# Patient Record
Sex: Female | Born: 1980 | Race: White | Hispanic: No | Marital: Married | Smoking: Never smoker
Health system: Southern US, Community
[De-identification: ages and names within clinical notes are randomized; demographics above are authoritative.]

## PROBLEM LIST (undated history)

## (undated) DIAGNOSIS — N390 Urinary tract infection, site not specified: Secondary | ICD-10-CM

## (undated) DIAGNOSIS — T7840XA Allergy, unspecified, initial encounter: Secondary | ICD-10-CM

## (undated) DIAGNOSIS — J302 Other seasonal allergic rhinitis: Secondary | ICD-10-CM

## (undated) DIAGNOSIS — O169 Unspecified maternal hypertension, unspecified trimester: Secondary | ICD-10-CM

## (undated) DIAGNOSIS — I10 Essential (primary) hypertension: Secondary | ICD-10-CM

## (undated) DIAGNOSIS — R12 Heartburn: Secondary | ICD-10-CM

## (undated) HISTORY — DX: Urinary tract infection, site not specified: N39.0

## (undated) HISTORY — PX: CHOLECYSTECTOMY: SHX55

## (undated) HISTORY — DX: Heartburn: R12

## (undated) HISTORY — DX: Other seasonal allergic rhinitis: J30.2

## (undated) HISTORY — DX: Unspecified maternal hypertension, unspecified trimester: O16.9

## (undated) HISTORY — DX: Allergy, unspecified, initial encounter: T78.40XA

---

## 2002-02-15 ENCOUNTER — Other Ambulatory Visit: Admission: RE | Admit: 2002-02-15 | Discharge: 2002-02-15 | Payer: Self-pay | Admitting: Obstetrics & Gynecology

## 2003-02-22 ENCOUNTER — Other Ambulatory Visit: Admission: RE | Admit: 2003-02-22 | Discharge: 2003-02-22 | Payer: Self-pay | Admitting: Obstetrics & Gynecology

## 2004-07-09 ENCOUNTER — Emergency Department (HOSPITAL_COMMUNITY): Admission: EM | Admit: 2004-07-09 | Discharge: 2004-07-09 | Payer: Self-pay | Admitting: Family Medicine

## 2004-09-15 ENCOUNTER — Emergency Department (HOSPITAL_COMMUNITY): Admission: EM | Admit: 2004-09-15 | Discharge: 2004-09-15 | Payer: Self-pay | Admitting: Family Medicine

## 2005-06-19 ENCOUNTER — Emergency Department (HOSPITAL_COMMUNITY): Admission: AD | Admit: 2005-06-19 | Discharge: 2005-06-19 | Payer: Self-pay | Admitting: Family Medicine

## 2006-04-12 ENCOUNTER — Other Ambulatory Visit: Admission: RE | Admit: 2006-04-12 | Discharge: 2006-04-12 | Payer: Self-pay | Admitting: Obstetrics and Gynecology

## 2007-08-10 DIAGNOSIS — O1490 Unspecified pre-eclampsia, unspecified trimester: Secondary | ICD-10-CM

## 2007-12-05 ENCOUNTER — Inpatient Hospital Stay (HOSPITAL_COMMUNITY): Admission: AD | Admit: 2007-12-05 | Discharge: 2007-12-05 | Payer: Self-pay | Admitting: Obstetrics and Gynecology

## 2007-12-07 ENCOUNTER — Inpatient Hospital Stay (HOSPITAL_COMMUNITY): Admission: AD | Admit: 2007-12-07 | Discharge: 2007-12-07 | Payer: Self-pay | Admitting: Obstetrics and Gynecology

## 2007-12-11 ENCOUNTER — Inpatient Hospital Stay (HOSPITAL_COMMUNITY): Admission: AD | Admit: 2007-12-11 | Discharge: 2007-12-15 | Payer: Self-pay | Admitting: Obstetrics and Gynecology

## 2008-08-29 ENCOUNTER — Emergency Department (HOSPITAL_COMMUNITY): Admission: EM | Admit: 2008-08-29 | Discharge: 2008-08-29 | Payer: Self-pay | Admitting: Family Medicine

## 2009-08-09 HISTORY — PX: CHOLECYSTECTOMY: SHX55

## 2010-05-26 ENCOUNTER — Ambulatory Visit (HOSPITAL_COMMUNITY): Admission: RE | Admit: 2010-05-26 | Discharge: 2010-05-26 | Payer: Self-pay | Admitting: General Surgery

## 2010-07-16 ENCOUNTER — Emergency Department (HOSPITAL_COMMUNITY): Admission: EM | Admit: 2010-07-16 | Discharge: 2010-03-21 | Payer: Self-pay | Admitting: Emergency Medicine

## 2010-10-21 LAB — CBC
HCT: 38.6 % (ref 36.0–46.0)
Hemoglobin: 12.1 g/dL (ref 12.0–15.0)
MCH: 26.9 pg (ref 26.0–34.0)
MCHC: 31.3 g/dL (ref 30.0–36.0)
MCV: 85.8 fL (ref 78.0–100.0)
Platelets: 274 10*3/uL (ref 150–400)
RBC: 4.5 MIL/uL (ref 3.87–5.11)
RDW: 14.4 % (ref 11.5–15.5)
WBC: 9 10*3/uL (ref 4.0–10.5)

## 2010-10-21 LAB — COMPREHENSIVE METABOLIC PANEL
ALT: 18 U/L (ref 0–35)
AST: 19 U/L (ref 0–37)
Albumin: 3.6 g/dL (ref 3.5–5.2)
Alkaline Phosphatase: 97 U/L (ref 39–117)
BUN: 7 mg/dL (ref 6–23)
CO2: 29 mEq/L (ref 19–32)
Calcium: 9.1 mg/dL (ref 8.4–10.5)
Chloride: 108 mEq/L (ref 96–112)
Creatinine, Ser: 0.75 mg/dL (ref 0.4–1.2)
GFR calc Af Amer: >60 mL/min (ref >60)
GFR calc non Af Amer: >60 mL/min (ref >60)
Glucose, Bld: 76 mg/dL (ref 70–99)
Potassium: 4.4 mEq/L (ref 3.5–5.1)
Sodium: 142 mEq/L (ref 135–145)
Total Bilirubin: 0.7 mg/dL (ref 0.3–1.2)
Total Protein: 6.7 g/dL (ref 6.0–8.3)

## 2010-10-21 LAB — DIFFERENTIAL
Basophils Absolute: 0 10*3/uL (ref 0.0–0.1)
Basophils Relative: 0 % (ref 0–1)
Eosinophils Absolute: 0 10*3/uL (ref 0.0–0.7)
Eosinophils Relative: 0 % (ref 0–5)
Lymphocytes Relative: 27 % (ref 12–46)
Lymphs Abs: 2.4 10*3/uL (ref 0.7–4.0)
Monocytes Absolute: 0.6 10*3/uL (ref 0.1–1.0)
Monocytes Relative: 7 % (ref 3–12)
Neutro Abs: 5.9 10*3/uL (ref 1.7–7.7)
Neutrophils Relative %: 66 % (ref 43–77)

## 2010-10-21 LAB — SURGICAL PCR SCREEN
MRSA, PCR: NEGATIVE
Staphylococcus aureus: NEGATIVE

## 2010-10-21 LAB — HCG, SERUM, QUALITATIVE: Preg, Serum: NEGATIVE

## 2010-10-23 LAB — DIFFERENTIAL
Basophils Absolute: 0 10*3/uL (ref 0.0–0.1)
Basophils Relative: 0 % (ref 0–1)
Eosinophils Absolute: 0 10*3/uL (ref 0.0–0.7)
Eosinophils Relative: 0 % (ref 0–5)
Lymphocytes Relative: 14 % (ref 12–46)
Lymphs Abs: 1.5 10*3/uL (ref 0.7–4.0)
Monocytes Absolute: 0.6 10*3/uL (ref 0.1–1.0)
Monocytes Relative: 6 % (ref 3–12)
Neutro Abs: 8.4 10*3/uL — ABNORMAL HIGH (ref 1.7–7.7)
Neutrophils Relative %: 79 % — ABNORMAL HIGH (ref 43–77)

## 2010-10-23 LAB — CBC
HCT: 37.4 % (ref 36.0–46.0)
Hemoglobin: 12.2 g/dL (ref 12.0–15.0)
MCH: 27.6 pg (ref 26.0–34.0)
MCHC: 32.6 g/dL (ref 30.0–36.0)
MCV: 84.6 fL (ref 78.0–100.0)
Platelets: 264 10*3/uL (ref 150–400)
RBC: 4.42 MIL/uL (ref 3.87–5.11)
RDW: 13.9 % (ref 11.5–15.5)
WBC: 10.6 10*3/uL — ABNORMAL HIGH (ref 4.0–10.5)

## 2010-10-23 LAB — URINE CULTURE
Colony Count: 60000
Culture  Setup Time: 201108131131

## 2010-10-23 LAB — COMPREHENSIVE METABOLIC PANEL
ALT: 18 U/L (ref 0–35)
AST: 17 U/L (ref 0–37)
Albumin: 3.7 g/dL (ref 3.5–5.2)
Alkaline Phosphatase: 77 U/L (ref 39–117)
BUN: 10 mg/dL (ref 6–23)
CO2: 24 mEq/L (ref 19–32)
Calcium: 8.4 mg/dL (ref 8.4–10.5)
Chloride: 104 mEq/L (ref 96–112)
Creatinine, Ser: 0.77 mg/dL (ref 0.4–1.2)
GFR calc Af Amer: >60 mL/min (ref >60)
GFR calc non Af Amer: >60 mL/min (ref >60)
Glucose, Bld: 116 mg/dL — ABNORMAL HIGH (ref 70–99)
Potassium: 3.6 mEq/L (ref 3.5–5.1)
Total Bilirubin: 0.5 mg/dL (ref 0.3–1.2)
Total Protein: 7.2 g/dL (ref 6.0–8.3)

## 2010-10-23 LAB — URINALYSIS, ROUTINE W REFLEX MICROSCOPIC
Bilirubin Urine: NEGATIVE
Glucose, UA: NEGATIVE mg/dL
Hgb urine dipstick: NEGATIVE
Ketones, ur: NEGATIVE mg/dL
Nitrite: NEGATIVE
Protein, ur: NEGATIVE mg/dL
Specific Gravity, Urine: 1.022 (ref 1.005–1.030)
Urobilinogen, UA: 1 mg/dL (ref 0.0–1.0)
pH: 7 (ref 5.0–8.0)

## 2010-10-23 LAB — POCT PREGNANCY, URINE: Preg Test, Ur: NEGATIVE

## 2010-10-23 LAB — LIPASE, BLOOD: Lipase: 26 U/L (ref 11–59)

## 2010-12-22 NOTE — Op Note (Signed)
NAMEVICKY, Robin Hopkins             ACCOUNT NO.:  [id number]   MEDICAL RECORD NO.:  [id number]          PATIENT TYPE:  INP   LOCATION:  9159                          FACILITY:  [facility]   PHYSICIAN:  Leighton Roach Meisinger, M.D.DATE OF BIRTH:  [birth year]   DATE OF PROCEDURE:  12/12/2007  DATE OF DISCHARGE:                               OPERATIVE REPORT   PREOPERATIVE DIAGNOSES:  Intrauterine pregnancy at 38 plus weeks,  preeclampsia, and arrest of descent.   POSTOPERATIVE DIAGNOSES:  Intrauterine pregnancy at 38 plus weeks,  preeclampsia, and arrest of descent.   PROCEDURE:  Primary low-transverse cesarean section without extensions.   SURGEON:  Zenaida Niece, MD   ASSISTANT:  Malachi Pro. Ambrose Mantle, MD   ANESTHESIA:  Epidural.   SPECIMENS:  Placenta sent to labor and delivery.   ESTIMATED BLOOD LOSS:  800 mL.   COMPLICATIONS:  None.   FINDINGS:  She had normal gravid anatomy and delivered a viable female  infant with Apgars of 4 and 9, weight 7 pounds 15 ounces.   PROCEDURE IN DETAIL:  The patient was taken to the operating room and  placed in the dorsal supine position with left lateral tilt.  Her  previously placed epidural was dosed appropriately.  Abdomen was then  prepped and draped in the usual sterile fashion.  A Foley catheter had  previously been placed.  The level of her anesthesia was found to be  adequate and abdomen was entered via a standard Pfannenstiel incision.  The Alexis disposable self-retaining retractor was placed in the lower  uterine segment and it was well exposed.  A 4-cm transverse incision was  made in the lower uterine segment.  Once the uterine cavity was entered,  the incision was extended digitally.  The fetal vertex was grasped and  delivered through the incision atraumatically.  Mouth and nares were  suctioned.  A nuchal cord x1 was reduced and the remainder of the infant  delivered atraumatically.  Cord was doubly clamped and cut and the  infant  handed to the awaiting pediatric team.  Cord blood was obtained  and the placenta was manually delivered.  Uterus was wiped dry with a  clean lap pads and all clots and debris removed.  Uterine incision was  inspected and found to be free of extensions.  The uterine incision was  closed in 2 layers with first layer being a running locking layer with  #1 chromic and the second layer being an imbricating layer also with #1  chromic.  A small amount of bleeding from the right side was controlled  with a  figure-of-eight suture of 3-0 Vicryl.  Tubes and ovaries were  inspected and found to be normal.  Uterine incision was again inspected  and found to be hemostatic.  Bleeding from serosal edges was controlled  with electrocautery.  The retractor was removed.  The subfascial space  was irrigated and made hemostatic with electrocautery.  The fascia was  closed in running fashion starting at both ends and meeting in the  middle with 0 Vicryl.  Subcutaneous tissue was irrigated and made  hemostatic  with  electrocautery.  Subcutaneous tissue was closed with running 2-0 plain  gut suture.  The skin was then closed with staples followed by sterile  dressing.  The patient tolerated the procedure well.  She was taken to  the recovery room in stable condition.  Counts were correct x2 and she  was given Ancef 1 g IV at the beginning of the procedure.      Zenaida Niece, M.D.  Electronically Signed     TDM/MEDQ  D:  12/12/2007  T:  12/12/2007  Job:  [id number]

## 2010-12-22 NOTE — Discharge Summary (Signed)
NAMETRISTEN, PENNINO             ACCOUNT NO.:  [id number]   MEDICAL RECORD NO.:  [id number]          PATIENT TYPE:  INP   LOCATION:  9104                          [facility]ACILITY:  [facility]   PHYSICIAN:  Zenaida Niece, M.D.DATE OF BIRTH:  Dec 18, 1980   DATE OF ADMISSION:  12/11/2007  DATE OF DISCHARGE:  12/15/2007                               DISCHARGE SUMMARY   ADMISSION DIAGNOSES:  1. Intrauterine pregnancy at 38 weeks.  2. Preeclampsia.   DISCHARGE DIAGNOSES:  1. Intrauterine pregnancy at 38 weeks.  2. Preeclampsia.  3. Arrest of descent.   PROCEDURES:  On May 5, she had a primary low-transverse cesarean  section.   HISTORY AND PHYSICAL:  This is a 30 year old white female gravida 1,  para 0 with an EGA of 38+ weeks who is admitted for induction due to  probable preeclampsia.  She was seen in the office on the day of  admission with blood pressure of 150/98 with no symptoms.  PIH labs were  drawn and were stable except for mildly elevated LFTs, so she was  admitted for induction.  Prenatal care complicated by nausea and  vomiting of pregnancy in the first trimester treated with Zofran,  polyhydramnios followed with NSTs and it resolved by ultrasound.  She  also had intermittent elevated blood pressure since 37 weeks with  elevated uric acid and no significant proteinuria.  She is also LGA by  ultrasound.  Prenatal labs blood type is A+ with negative antibody  screen, RPR nonreactive, rubella immune, hepatitis B surface antigen  negative, HIV negative, gonorrhea and chlamydia negative, cystic  fibrosis negative, 1-hour Glucola 106, group B strep is negative.   PAST MEDICAL HISTORY:  History of Bell palsy.  The remainder of her  history is noncontributory.   PHYSICAL EXAMINATION:  She is afebrile with stable vital signs except  for blood pressure of 130/90 on admission.  Fetal heart tracing is  reactive.  ABDOMEN:  Gravid, nontender with an estimated fetal weight of 9 pounds.  Cervix is 2, 70, -2 to -3, vertex presentation, adequate pelvis, and  membranes were ruptured revealing clear fluid.  EXTREMITIES:  2+ edema.  DTRs were 2/4.  No clonus.   HOSPITAL COURSE:  The patient was admitted, put on magnesium for seizure  prophylaxis, and started on Pitocin.  On my first exam, I was able to  rupture membranes.  She progressed into active labor, reached complete  and pushed for approximately 2 hours but was not able to bring the  vertex past a +1 to +2 station and became exhausted.  Thus early on the  morning of May 5, she had a primary low-transverse cesarean section for  arrest of descent under epidural anesthesia.  Estimated blood loss was  800 mL.  She had normal anatomy and delivered a viable female infant with  Apgars of 4 and 9, weight 7 pounds 15 ounces.  Postoperatively, she was  continued on her magnesium sulfate.  Blood pressures remained stable but  urine output was sluggish.  Preoperative hemoglobin 12 and on  postoperative day #1 was 10.2.  Creatinine remained slightly  elevated,  LFTs actually bumped up a little bit, LDH bumped up, and uric acid  bumped up.  She was continued on magnesium sulfate through the day on  postoperative #1.  The morning of postoperative #2, blood pressures were  stable and she was beginning to diurese.  Labs were improving, so her  magnesium was discontinued.  She was then transferred to the floor.  On  the floor, blood pressures remained stable.  On postoperative #3, labs  continued to improve and she was felt to be stable enough for discharge  home.  Her incision was healing well, but staples were left intact.   DISCHARGE INSTRUCTIONS:  Regular diet, pelvic rest, no strenuous  activity.  Followup is in 3-4 days for staple removal.  Medications are  Percocet #30 one to two p.o. q. 4-6 h. p.r.n. pain and over-the-counter  ibuprofen as needed, and she is given our discharge pamphlet.      Zenaida Niece, M.D.   Electronically Signed     TDM/MEDQ  D:  12/15/2007  T:  12/15/2007  Job:  [id number]

## 2011-05-04 LAB — CREATININE, URINE, 24 HOUR
Collection Interval-UCRE24: 24
Creatinine, 24H Ur: 1721
Creatinine, Urine: 172.1
Urine Total Volume-UCRE24: 1000

## 2011-05-04 LAB — URINALYSIS, ROUTINE W REFLEX MICROSCOPIC
Glucose, UA: NEGATIVE
Ketones, ur: 15 — AB
Leukocytes, UA: NEGATIVE
Nitrite: NEGATIVE
Protein, ur: 30 — AB
Specific Gravity, Urine: 1.025
Urobilinogen, UA: 2 — ABNORMAL HIGH
pH: 5.5

## 2011-05-04 LAB — PROTEIN, URINE, 24 HOUR
Collection Interval-UPROT: 24
Protein, 24H Urine: 100
Protein, Urine: 10
Urine Total Volume-UPROT: 1000

## 2011-05-04 LAB — CBC
HCT: 33.5 — ABNORMAL LOW
HCT: 33.5 — ABNORMAL LOW
Hemoglobin: 11.4 — ABNORMAL LOW
Hemoglobin: 11.7 — ABNORMAL LOW
MCHC: 34
MCHC: 34.8
MCV: 83
MCV: 83.8
Platelets: 216
Platelets: 224
RBC: 3.99
RBC: 4.04
RDW: 15.3
RDW: 15.3
WBC: 9.2
WBC: 9.3

## 2011-05-04 LAB — COMPREHENSIVE METABOLIC PANEL
ALT: 23
ALT: 31
AST: 22
AST: 26
Albumin: 2.3 — ABNORMAL LOW
Albumin: 2.3 — ABNORMAL LOW
Alkaline Phosphatase: 143 — ABNORMAL HIGH
Alkaline Phosphatase: 147 — ABNORMAL HIGH
BUN: 7
BUN: 7
CO2: 21
CO2: 22
Calcium: 8.5
Calcium: 8.6
Chloride: 108
Chloride: 108
Creatinine, Ser: 0.71
Creatinine, Ser: 0.71
GFR calc Af Amer: >60
GFR calc Af Amer: >60
GFR calc non Af Amer: >60
GFR calc non Af Amer: >60
Glucose, Bld: 102 — ABNORMAL HIGH
Glucose, Bld: 103 — ABNORMAL HIGH
Potassium: 3.7
Potassium: 3.9
Sodium: 136
Sodium: 137
Total Bilirubin: 0.7
Total Bilirubin: 0.7
Total Protein: 5.8 — ABNORMAL LOW
Total Protein: 5.9 — ABNORMAL LOW

## 2011-05-04 LAB — URIC ACID
Uric Acid, Serum: 7.9 — ABNORMAL HIGH
Uric Acid, Serum: 8.1 — ABNORMAL HIGH

## 2011-05-04 LAB — URINE MICROSCOPIC-ADD ON

## 2011-05-04 LAB — LACTATE DEHYDROGENASE: LDH: 154

## 2013-10-13 ENCOUNTER — Emergency Department (HOSPITAL_BASED_OUTPATIENT_CLINIC_OR_DEPARTMENT_OTHER): Payer: Managed Care, Other (non HMO)

## 2013-10-13 ENCOUNTER — Encounter (HOSPITAL_BASED_OUTPATIENT_CLINIC_OR_DEPARTMENT_OTHER): Payer: Self-pay | Admitting: Emergency Medicine

## 2013-10-13 ENCOUNTER — Emergency Department (HOSPITAL_BASED_OUTPATIENT_CLINIC_OR_DEPARTMENT_OTHER)
Admission: EM | Admit: 2013-10-13 | Discharge: 2013-10-13 | Disposition: A | Discharge status: Home / Self Care | Payer: Managed Care, Other (non HMO) | Attending: Emergency Medicine | Admitting: Emergency Medicine

## 2013-10-13 DIAGNOSIS — X500XXA Overexertion from strenuous movement or load, initial encounter: Secondary | ICD-10-CM | POA: Insufficient documentation

## 2013-10-13 DIAGNOSIS — Y929 Unspecified place or not applicable: Secondary | ICD-10-CM | POA: Insufficient documentation

## 2013-10-13 DIAGNOSIS — S6390XA Sprain of unspecified part of unspecified wrist and hand, initial encounter: Secondary | ICD-10-CM | POA: Insufficient documentation

## 2013-10-13 DIAGNOSIS — E669 Obesity, unspecified: Secondary | ICD-10-CM | POA: Insufficient documentation

## 2013-10-13 DIAGNOSIS — Z87891 Personal history of nicotine dependence: Secondary | ICD-10-CM | POA: Insufficient documentation

## 2013-10-13 DIAGNOSIS — R030 Elevated blood-pressure reading, without diagnosis of hypertension: Secondary | ICD-10-CM | POA: Insufficient documentation

## 2013-10-13 DIAGNOSIS — Y9389 Activity, other specified: Secondary | ICD-10-CM | POA: Insufficient documentation

## 2013-10-13 DIAGNOSIS — S63619A Unspecified sprain of unspecified finger, initial encounter: Secondary | ICD-10-CM

## 2013-10-13 MED ORDER — NAPROXEN 500 MG PO TABS: 500.0000 mg | ORAL_TABLET | Freq: Two times a day (BID) | ORAL | Status: DC

## 2013-10-13 MED ORDER — HYDROCODONE-ACETAMINOPHEN 5-325 MG PO TABS: 1.0000 | ORAL_TABLET | ORAL | Status: DC | PRN

## 2013-10-13 NOTE — Discharge Instructions (Signed)
°  Wear the splint for comfort, apply ice to the area, elevate. Follow up with the orthopedic doctor if symptoms persist. Do not take the narcotic if you are driving as it will make you sleepy.   Finger Sprain A finger sprain is a tear in one of the strong, fibrous tissues that connect the bones (ligaments) in your finger. The severity of the sprain depends on how much of the ligament is torn. The tear can be either partial or complete. CAUSES  Often, sprains are a result of a fall or accident. If you extend your hands to catch an object or to protect yourself, the force of the impact causes the fibers of your ligament to stretch too much. This excess tension causes the fibers of your ligament to tear. SYMPTOMS  You may have some loss of motion in your finger. Other symptoms include:  Bruising.  Tenderness.  Swelling. DIAGNOSIS  In order to diagnose finger sprain, your caregiver will physically examine your finger or thumb to determine how torn the ligament is. Your caregiver may also suggest an X-ray exam of your finger to make sure no bones are broken. TREATMENT  If your ligament is only partially torn, treatment usually involves keeping the finger in a fixed position (immobilization) for a short period. To do this, your caregiver will apply a bandage, cast, or splint to keep your finger from moving until it heals. For a partially torn ligament, the healing process usually takes 2 to 3 weeks. If your ligament is completely torn, you may need surgery to reconnect the ligament to the bone. After surgery a cast or splint will be applied and will need to stay on your finger or thumb for 4 to 6 weeks while your ligament heals. HOME CARE INSTRUCTIONS  Keep your injured finger elevated, when possible, to decrease swelling.  To ease pain and swelling, apply ice to your joint twice a day, for 2 to 3 days:  Put ice in a plastic bag.  Place a towel between your skin and the bag.  Leave the ice on  for 15 minutes.  Only take over-the-counter or prescription medicine for pain as directed by your caregiver.  Do not wear rings on your injured finger.  Do not leave your finger unprotected until pain and stiffness go away (usually 3 to 4 weeks).  Do not allow your cast or splint to get wet. Cover your cast or splint with a plastic bag when you shower or bathe. Do not swim.  Your caregiver may suggest special exercises for you to do during your recovery to prevent or limit permanent stiffness. SEEK IMMEDIATE MEDICAL CARE IF:  Your cast or splint becomes damaged.  Your pain becomes worse rather than better. MAKE SURE YOU:  Understand these instructions.  Will watch your condition.  Will get help right away if you are not doing well or get worse. Document Released: 09/02/2004 Document Revised: 10/18/2011 Document Reviewed: 03/29/2011 [facility] Patient Information 2014 [place], [place].

## 2013-10-13 NOTE — ED Provider Notes (Signed)
CSN: [id number]     Arrival date & time 10/13/13  2123 History   First MD Initiated Contact with Patient 10/13/13 2202     Chief Complaint  Patient presents with  . Hand Pain     (Consider location/radiation/quality/duration/timing/severity/associated sxs/prior Treatment) Patient is a 33 y.o. female presenting with hand pain. The history is provided by the patient.  Hand Pain This is a new problem. The current episode started yesterday. The problem occurs constantly. The problem has been unchanged. She has tried nothing for the symptoms.   Robin Hopkins is a 33 y.o. female who presents to the ED with pain in the right ring finger that started yesterday after she tried to open a door with her right finger and felt a pop. She complains of pain and decreased strength in the finger. The pain radiates to the hand. Pain with any range of motion.  History reviewed. No pertinent past medical history. Past Surgical History  Procedure Laterality Date  . Cholecystectomy     No family history on file. History  Substance Use Topics  . Smoking status: Former Games developermoker  . Smokeless tobacco: Not on file  . Alcohol Use: Yes   OB History   Grav Para Term Preterm Abortions TAB SAB Ect Mult Living                 Review of Systems Negative except as stated in HPI   Allergies  Review of patient's allergies indicates not on file.  Home Medications  No current outpatient prescriptions on file. BP 145/106  Pulse 74  Temp(Src) 98.1 F (36.7 C) (Oral)  Resp 20  Ht 5\' 3"  (1.6 m)  Wt 285 lb (129.275 kg)  BMI 50.50 kg/m2  SpO2 98%  LMP 10/03/2013 Physical Exam  Nursing note and vitals reviewed. Constitutional: She is oriented to person, place, and time.  Obese, elevated BP   HENT:  Head: Normocephalic.  Eyes: Conjunctivae and EOM are normal.  Neck: Neck supple.  Cardiovascular: Normal rate.   Pulmonary/Chest: Effort normal.  Musculoskeletal:       Right hand: She exhibits decreased  range of motion, tenderness and swelling. She exhibits normal capillary refill, no deformity and no laceration. Normal sensation noted. Normal strength noted.       Hands: Tenderness, swelling and pain with range of motion and palpation of the right ring finger.   Neurological: She is alert and oriented to person, place, and time. No cranial nerve deficit.  Skin: Skin is warm and dry.  Psychiatric: She has a normal mood and affect. Her behavior is normal.    ED Course  Procedures ( Dg Hand Complete Right  10/13/2013   CLINICAL DATA:  Right ring finger pain after trying to open a door handle.  EXAM: RIGHT HAND - COMPLETE 3+ VIEW  COMPARISON:  None.  FINDINGS: There is no evidence of fracture or dislocation. There is no evidence of arthropathy or other focal bone abnormality. Soft tissues are unremarkable.  IMPRESSION: Negative.   Electronically Signed   By: Davonna BellingJohn  Curnes M.D.   On: 10/13/2013 23:20   Splint applied to finger, ice, elevation and pain management.   MDM  33 y.o. female with pain in the right ring finger s/p injury yesterday. Discussed in detail with the patient that x-ray only shows bone injury and if symptoms persist she will need to follow up with ortho for further evaluation of possible ligament injury. She voices understanding. Splint applied, ice, elevation, pain management. She  is stable for discharge and remains neurovascularly intact.    Medication List         HYDROcodone-acetaminophen 5-325 MG per tablet  Commonly known as:  NORCO/VICODIN  Take 1-2 tablets by mouth every 4 (four) hours as needed.     naproxen 500 MG tablet  Commonly known as:  NAPROSYN  Take 1 tablet (500 mg total) by mouth 2 (two) times daily.           Janne Napoleon, [place] 10/13/13 [phone]

## 2013-10-13 NOTE — ED Notes (Signed)
Tried to open a door handle with her right ring finger yesterday-felt a pop.  C/o pain, decreased ability to grip something since then.

## 2013-10-14 NOTE — ED Provider Notes (Signed)
Medical screening examination/treatment/procedure(s) were performed by non-physician practitioner and as supervising physician I was immediately available for consultation/collaboration.   EKG Interpretation None       Doug SouSam Thuy Atilano, MD 10/14/13 2312

## 2014-02-12 DIAGNOSIS — J309 Allergic rhinitis, unspecified: Secondary | ICD-10-CM | POA: Insufficient documentation

## 2015-08-22 ENCOUNTER — Emergency Department (INDEPENDENT_AMBULATORY_CARE_PROVIDER_SITE_OTHER)
Admission: EM | Admit: 2015-08-22 | Discharge: 2015-08-22 | Disposition: A | Discharge status: Home / Self Care | Payer: Managed Care, Other (non HMO) | Source: Home / Self Care | Attending: Family Medicine | Admitting: Family Medicine

## 2015-08-22 ENCOUNTER — Encounter: Payer: Self-pay | Admitting: *Deleted

## 2015-08-22 DIAGNOSIS — J209 Acute bronchitis, unspecified: Secondary | ICD-10-CM

## 2015-08-22 DIAGNOSIS — J069 Acute upper respiratory infection, unspecified: Secondary | ICD-10-CM | POA: Diagnosis not present

## 2015-08-22 HISTORY — DX: Essential (primary) hypertension: I10

## 2015-08-22 MED ORDER — BENZONATATE 100 MG PO CAPS: 100.0000 mg | ORAL_CAPSULE | Freq: Three times a day (TID) | ORAL | Status: DC

## 2015-08-22 MED ORDER — PREDNISONE 20 MG PO TABS: ORAL_TABLET | ORAL | Status: DC

## 2015-08-22 MED ORDER — AZITHROMYCIN 250 MG PO TABS: 250.0000 mg | ORAL_TABLET | Freq: Every day | ORAL | Status: DC

## 2015-08-22 NOTE — Discharge Instructions (Signed)
Acute Bronchitis Bronchitis is when the airways that extend from the windpipe into the lungs get red, puffy, and painful (inflamed). Bronchitis often causes thick spit (mucus) to develop. This leads to a cough. A cough is the most common symptom of bronchitis. In acute bronchitis, the condition usually begins suddenly and goes away over time (usually in 2 weeks). Smoking, allergies, and asthma can make bronchitis worse. Repeated episodes of bronchitis may cause more lung problems. HOME CARE  Rest.  Drink enough fluids to keep your pee (urine) clear or pale yellow (unless you need to limit fluids as told by your doctor).  Only take over-the-counter or prescription medicines as told by your doctor.  Avoid smoking and secondhand smoke. These can make bronchitis worse. If you are a smoker, think about using nicotine gum or skin patches. Quitting smoking will help your lungs heal faster.  Reduce the chance of getting bronchitis again by:  Washing your hands often.  Avoiding people with cold symptoms.  Trying not to touch your hands to your mouth, nose, or eyes.  Follow up with your doctor as told. GET HELP IF: Your symptoms do not improve after 1 week of treatment. Symptoms include:  Cough.  Fever.  Coughing up thick spit.  Body aches.  Chest congestion.  Chills.  Shortness of breath.  Sore throat. GET HELP RIGHT AWAY IF:   You have an increased fever.  You have chills.  You have severe shortness of breath.  You have bloody thick spit (sputum).  You throw up (vomit) often.  You lose too much body fluid (dehydration).  You have a severe headache.  You faint. MAKE SURE YOU:   Understand these instructions.  Will watch your condition.  Will get help right away if you are not doing well or get worse.   This information is not intended to replace advice given to you by your health care provider. Make sure you discuss any questions you have with your health care  provider.   Cool Mist Vaporizers Vaporizers may help relieve the symptoms of a cough and cold. They add moisture to the air, which helps mucus to become thinner and less sticky. This makes it easier to breathe and cough up secretions. Cool mist vaporizers do not cause serious burns like hot mist vaporizers, which may also be called steamers or humidifiers. Vaporizers have not been proven to help with colds. You should not use a vaporizer if you are allergic to mold. HOME CARE INSTRUCTIONS  Follow the package instructions for the vaporizer.  Do not use anything other than distilled water in the vaporizer.  Do not run the vaporizer all of the time. This can cause mold or bacteria to grow in the vaporizer.  Clean the vaporizer after each time it is used.  Clean and dry the vaporizer well before storing it.  Stop using the vaporizer if worsening respiratory symptoms develop.   This information is not intended to replace advice given to you by your health care provider. Make sure you discuss any questions you have with your health care provider.   Document Released: 04/22/2004 Document Revised: 07/31/2013 Document Reviewed: 12/13/2012 Elsevier Interactive Patient Education 2016 Elsevier Inc.  Document Released: 01/12/2008 Document Revised: 03/28/2013 Document Reviewed: 01/16/2013 Elsevier Interactive Patient Education [employer] Inc.

## 2015-08-22 NOTE — ED Notes (Signed)
Pt c/o nonproductive cough x 1 wk. Denies fever.  

## 2015-08-22 NOTE — ED Provider Notes (Signed)
CSN: [id number]     Arrival date & time 08/22/15  1912 History   First MD Initiated Contact with Patient 08/22/15 1916     Chief Complaint  Patient presents with  . Cough   (Consider location/radiation/quality/duration/timing/severity/associated sxs/prior Treatment) HPI  [facility] 35[facility]/o worsening non-productive cough for 7 days.  Pt states cough was initially productive but is now hacking causing her to have post-tussive vomiting. Pt states coworkers have also showed concern for the severity of her cough. She reports hx of similar symptoms about 1-2 months ago and the cough lasted about 2 weeks but states it "wore her out."  Reports having an inhaler in the past but does not feel like she needs one now. Denies fever, chills, nausea or diarrhea.    Past Medical History  Diagnosis Date  . Hypertension    Past Surgical History  Procedure Laterality Date  . Cholecystectomy    . Cesarean section     History reviewed. No pertinent family history. Social History  Substance Use Topics  . Smoking status: Former Games developermoker  . Smokeless tobacco: None  . Alcohol Use: Yes   OB History    No data available     Review of Systems  Constitutional: Positive for fatigue. Negative for fever and chills.  HENT: Positive for congestion, sore throat and voice change. Negative for ear pain and trouble swallowing.   Respiratory: Positive for cough. Negative for chest tightness and shortness of breath.   Cardiovascular: Negative for chest pain and palpitations.  Gastrointestinal: Negative for nausea, vomiting, abdominal pain, diarrhea, constipation, blood in stool, abdominal distention, anal bleeding and rectal pain.  Genitourinary: Negative for dysuria, hematuria and flank pain.  Musculoskeletal: Positive for myalgias and arthralgias. Negative for back pain.  Skin: Negative.  Negative for rash.  Neurological: Positive for headaches. Negative for dizziness, weakness and  light-headedness.    Allergies  Review of patient's allergies indicates no known allergies.  Home Medications   Prior to Admission medications   Medication Sig Start Date End Date Taking? Authorizing Provider  azithromycin (ZITHROMAX) 250 MG tablet Take 1 tablet (250 mg total) by mouth daily. Take first 2 tablets together, then 1 every day until finished. 08/22/15   Junius FinnerErin O'Malley, PA-C  benzonatate (TESSALON) 100 MG capsule Take 1 capsule (100 mg total) by mouth every 8 (eight) hours. 08/22/15   Junius FinnerErin O'Malley, PA-C  predniSONE (DELTASONE) 20 MG tablet 3 tabs po day one, then 2 po daily x 4 days 08/22/15   Junius FinnerErin O'Malley, PA-C   Meds Ordered and Administered this Visit  Medications - No data to display  BP 128/96 mmHg  Pulse 92  Temp(Src) 98.2 F (36.8 C) (Oral)  Resp 18  Ht 5\' 3"  (1.6 m)  Wt 285 lb (129.275 kg)  BMI 50.50 kg/m2  SpO2 98%  LMP 08/11/2015 No data found.   Physical Exam  Constitutional: She appears well-developed and well-nourished. No distress.  HENT:  Head: Normocephalic and atraumatic.  Right Ear: Hearing, tympanic membrane, external ear and ear canal normal.  Left Ear: Hearing, tympanic membrane, external ear and ear canal normal.  Nose: Mucosal edema present.  Mouth/Throat: Uvula is midline and mucous membranes are normal. Posterior oropharyngeal erythema present. No oropharyngeal exudate, posterior oropharyngeal edema or tonsillar abscesses.  Eyes: Conjunctivae are normal. No scleral icterus.  Neck: Normal range of motion. Neck supple.  Cardiovascular: Normal rate, regular rhythm and normal heart sounds.   Pulmonary/Chest: Effort normal and  breath sounds normal. No respiratory distress. She has no wheezes. She has no rales. She exhibits no tenderness.  Dry hacking coughing fits during exam. Able to speak in full sentences between coughing.  Lungs: CTAB. No wheeze or rhonchi.  Abdominal: Soft. Bowel sounds are normal. She exhibits no distension and no mass.  There is no tenderness. There is no rebound and no guarding.  Musculoskeletal: Normal range of motion.  Neurological: She is alert.  Skin: Skin is warm and dry. She is not diaphoretic.  Nursing note and vitals reviewed.   ED Course  Procedures (including critical care time)  Labs Review Labs Reviewed - No data to display  Imaging Review No results found.     MDM   1. Acute bronchitis, unspecified organism   2. Acute upper respiratory infection    Pt c/o worsening hacking cough that has lead to post-tussive vomiting.  No respiratory distress but pt did have a coughing fit during exam.  O2 Sat 98% on RA  Will tx for atypical bacteria and place pt on oral steroids.  Rx: azithromycin, prednisone x5 days, and tessalon.  Advised pt to use acetaminophen and ibuprofen as needed for fever and pain. Encouraged rest and fluids. F/u with PCP in 7-10 days if not improving, sooner if worsening. Pt verbalized understanding and agreement with tx plan.     Junius Finner, PA-C 08/22/15 1952

## 2016-08-13 ENCOUNTER — Encounter: Payer: Self-pay | Admitting: *Deleted

## 2016-08-13 ENCOUNTER — Emergency Department (INDEPENDENT_AMBULATORY_CARE_PROVIDER_SITE_OTHER)
Admission: EM | Admit: 2016-08-13 | Discharge: 2016-08-13 | Disposition: A | Discharge status: Home / Self Care | Payer: Managed Care, Other (non HMO) | Source: Home / Self Care | Attending: Family Medicine | Admitting: Family Medicine

## 2016-08-13 DIAGNOSIS — J069 Acute upper respiratory infection, unspecified: Secondary | ICD-10-CM | POA: Diagnosis not present

## 2016-08-13 DIAGNOSIS — B9789 Other viral agents as the cause of diseases classified elsewhere: Secondary | ICD-10-CM | POA: Diagnosis not present

## 2016-08-13 MED ORDER — BENZONATATE 200 MG PO CAPS: ORAL_CAPSULE | ORAL | 0 refills | Status: DC

## 2016-08-13 MED ORDER — PREDNISONE 20 MG PO TABS: ORAL_TABLET | ORAL | 0 refills | Status: DC

## 2016-08-13 MED ORDER — DOXYCYCLINE HYCLATE 100 MG PO CAPS: 100.0000 mg | ORAL_CAPSULE | Freq: Two times a day (BID) | ORAL | 0 refills | Status: DC

## 2016-08-13 NOTE — Discharge Instructions (Signed)
Take plain guaifenesin (1200mg extended release tabs such as Mucinex) twice daily, with plenty of water, for cough and congestion.  May add Pseudoephedrine (30mg, one or two every 4 to 6 hours) for sinus congestion.  Get adequate rest.   °May use Afrin nasal spray (or generic oxymetazoline) twice daily for about 5 days and then discontinue.  Also recommend using saline nasal spray several times daily and saline nasal irrigation (AYR is a common brand).  Use Flonase nasal spray each morning after using Afrin nasal spray and saline nasal irrigation. °Try warm salt water gargles for sore throat.  °Stop all antihistamines for now, and other non-prescription cough/cold preparations. °  °  °

## 2016-08-13 NOTE — ED Provider Notes (Signed)
[facility]    CSN: [id number] Arrival date & time: 08/13/16  1711     History   Chief Complaint Chief Complaint  Patient presents with  . Cough    HPI Robin Hopkins is a 36 y.o. female.   One week ago patient developed typical cold-like symptoms developing over several days, including mild sore throat, sneezing, sinus congestion, headache, fatigue, and cough.  She continues to have cough and night sweats.  No pleuritic pain.   The history is provided by the patient.    Past Medical History:  Diagnosis Date  . Hypertension     There are no active problems to display for this patient.   Past Surgical History:  Procedure Laterality Date  . CESAREAN SECTION    . CHOLECYSTECTOMY      OB History    No data available       Home Medications    Prior to Admission medications   Medication Sig Start Date End Date Taking? Authorizing Provider  benzonatate (TESSALON) 200 MG capsule Take one cap by mouth at bedtime as needed for cough.  May repeat in 4 to 6 hours 08/13/16   Lattie HawStephen A Lailanie Hasley, MD  doxycycline (VIBRAMYCIN) 100 MG capsule Take 1 capsule (100 mg total) by mouth 2 (two) times daily. Take with food. 08/13/16   Lattie HawStephen A Courtnay Petrilla, MD  predniSONE (DELTASONE) 20 MG tablet Take one tab by mouth twice daily for 5 days, then one daily. Take with food. 08/13/16   Lattie HawStephen A Meloney Feld, MD    Family History History reviewed. No pertinent family history.  Social History Social History  Substance Use Topics  . Smoking status: Former Games developermoker  . Smokeless tobacco: Never Used  . Alcohol use Yes     Allergies   Patient has no known allergies.   Review of Systems Review of Systems  + sore throat + cough No pleuritic pain No wheezing + nasal congestion + post-nasal drainage No sinus pain/pressure No itchy/red eyes No earache No hemoptysis No SOB ? fever, + chills/night sweats No nausea No vomiting No abdominal pain No diarrhea No urinary symptoms No  skin rash + fatigue No myalgias + headache Used OTC meds without relief    Physical Exam Triage Vital Signs ED Triage Vitals  Enc Vitals Group     BP 08/13/16 1725 128/95     Pulse Rate 08/13/16 1725 96     Resp 08/13/16 1725 16     Temp 08/13/16 1725 98.6 F (37 C)     Temp Source 08/13/16 1725 Oral     SpO2 08/13/16 1725 96 %     Weight 08/13/16 1725 278 lb (126.1 kg)     Height --      Head Circumference --      Peak Flow --      Pain Score 08/13/16 1727 0     Pain Loc --      Pain Edu? --      Excl. in GC? --    No data found.   Updated Vital Signs BP 128/95 (BP Location: Left Arm)   Pulse 96   Temp 98.6 F (37 C) (Oral)   Resp 16   Wt 278 lb (126.1 kg)   LMP 08/06/2016   SpO2 96%   BMI 49.25 kg/m   Visual Acuity Right Eye Distance:   Left Eye Distance:   Bilateral Distance:    Right Eye Near:   Left Eye Near:  Bilateral Near:     Physical Exam Nursing notes and Vital Signs reviewed. Appearance:  Patient appears stated age, and in no acute distress Eyes:  Pupils are equal, round, and reactive to light and accomodation.  Extraocular movement is intact.  Conjunctivae are not inflamed  Ears:  Canals normal.  Tympanic membranes normal.  Nose:  Mildly congested turbinates.  No sinus tenderness.   Pharynx:  Normal Neck:  Supple.  Tender enlarged posterior/lateral nodes are palpated bilaterally  Lungs:  Clear to auscultation.  Breath sounds are equal.  Moving air well. Heart:  Regular rate and rhythm without murmurs, rubs, or gallops.  Abdomen:  Nontender without masses or hepatosplenomegaly.  Bowel sounds are present.  No CVA or flank tenderness.  Extremities:  No edema.  Skin:  No rash present.    UC Treatments / Results  Labs (all labs ordered are listed, but only abnormal results are displayed) Labs Reviewed - No data to display  EKG  EKG Interpretation None       Radiology No results found.  Procedures Procedures (including critical  care time)  Medications Ordered in UC Medications - No data to display   Initial Impression / Assessment and Plan / UC Course  I have reviewed the triage vital signs and the nursing notes.  Pertinent labs & imaging results that were available during my care of the patient were reviewed by me and considered in my medical decision making (see chart for details).  Clinical Course   ? Developing bacterial bronchitis Begin doxycycline 100mg  BID for atypical coverage, and prednisone burst/taper. Prescription written for Benzonatate [facility]) to take at bedtime for night-time cough.  Take plain guaifenesin (1200mg  extended release tabs such as Mucinex) twice daily, with plenty of water, for cough and congestion.  May add Pseudoephedrine (30mg , one or two every 4 to 6 hours) for sinus congestion.  Get adequate rest.   May use Afrin nasal spray (or generic oxymetazoline) twice daily for about 5 days and then discontinue.  Also recommend using saline nasal spray several times daily and saline nasal irrigation (AYR is a common brand).  Use Flonase nasal spray each morning after using Afrin nasal spray and saline nasal irrigation. Try warm salt water gargles for sore throat.  Stop all antihistamines for now, and other non-prescription cough/cold preparations. Followup with Family Doctor if not improved in one week.     Final Clinical Impressions(s) / UC Diagnoses   Final diagnoses:  Viral URI with cough    New Prescriptions New Prescriptions   BENZONATATE (TESSALON) 200 MG CAPSULE    Take one cap by mouth at bedtime as needed for cough.  May repeat in 4 to 6 hours   DOXYCYCLINE (VIBRAMYCIN) 100 MG CAPSULE    Take 1 capsule (100 mg total) by mouth 2 (two) times daily. Take with food.   PREDNISONE (DELTASONE) 20 MG TABLET    Take one tab by mouth twice daily for 5 days, then one daily. Take with food.     Lattie Haw, MD 09/05/16 [phone]

## 2016-08-13 NOTE — ED Triage Notes (Signed)
Patient c/o 1 week of cough congestion, sneezing, eyes watering. Possible fever @ night with sweats. Day/nyquil otc, delsym and Flonase.

## 2018-08-09 ENCOUNTER — Emergency Department (INDEPENDENT_AMBULATORY_CARE_PROVIDER_SITE_OTHER)
Admission: EM | Admit: 2018-08-09 | Discharge: 2018-08-09 | Disposition: A | Discharge status: Home / Self Care | Payer: Managed Care, Other (non HMO) | Source: Home / Self Care | Attending: Family Medicine | Admitting: Family Medicine

## 2018-08-09 ENCOUNTER — Other Ambulatory Visit: Payer: Self-pay

## 2018-08-09 ENCOUNTER — Encounter: Payer: Self-pay | Admitting: *Deleted

## 2018-08-09 DIAGNOSIS — J069 Acute upper respiratory infection, unspecified: Secondary | ICD-10-CM

## 2018-08-09 MED ORDER — BENZONATATE 100 MG PO CAPS: 100.0000 mg | ORAL_CAPSULE | Freq: Three times a day (TID) | ORAL | 0 refills | Status: DC

## 2018-08-09 MED ORDER — AZITHROMYCIN 250 MG PO TABS: 250.0000 mg | ORAL_TABLET | Freq: Every day | ORAL | 0 refills | Status: DC

## 2018-08-09 NOTE — ED Triage Notes (Signed)
Pt c/o lingering nonproductive cough x 08/02/18. She has taken Nyquil and Dayquil.

## 2018-08-09 NOTE — ED Provider Notes (Signed)
[facility]    CSN: [id number] Arrival date & time: 08/09/18  1440     History   Chief Complaint Chief Complaint  Patient presents with  . Cough    HPI Robin Hopkins is a 38 y.o. female.   HPI Robin Hopkins is a 38 y.o. female presenting to UC with c/o lingering nonproductive cough since 08/02/18.  She has taken Nyquil and Dayquil with mild relief. She returns to work tomorrow but wants the cough to improve. Cough keeps her up at night. Denies fever, chills, n/v/d.   Past Medical History:  Diagnosis Date  . Hypertension     There are no active problems to display for this patient.   Past Surgical History:  Procedure Laterality Date  . CESAREAN SECTION    . CHOLECYSTECTOMY      OB History   No obstetric history on file.      Home Medications    Prior to Admission medications   Medication Sig Start Date End Date Taking? Authorizing Provider  azithromycin (ZITHROMAX) 250 MG tablet Take 1 tablet (250 mg total) by mouth daily. Take first 2 tablets together, then 1 every day until finished. 08/09/18   Lurene ShadowPhelps, Tricha Ruggirello O, PA-C  benzonatate (TESSALON) 100 MG capsule Take 1-2 capsules (100-200 mg total) by mouth every 8 (eight) hours. 08/09/18   Lurene ShadowPhelps, Hillari Zumwalt O, PA-C    Family History History reviewed. No pertinent family history.  Social History Social History   Tobacco Use  . Smoking status: Former Games developermoker  . Smokeless tobacco: Never Used  Substance Use Topics  . Alcohol use: Yes  . Drug use: No     Allergies   Patient has no known allergies.   Review of Systems Review of Systems  Constitutional: Negative for chills and fever.  HENT: Positive for congestion. Negative for ear pain, sore throat, trouble swallowing and voice change.   Respiratory: Positive for cough. Negative for shortness of breath.   Cardiovascular: Negative for chest pain and palpitations.  Gastrointestinal: Negative for abdominal pain, diarrhea, nausea and vomiting.    Musculoskeletal: Negative for arthralgias, back pain and myalgias.  Skin: Negative for rash.     Physical Exam Triage Vital Signs ED Triage Vitals  Enc Vitals Group     BP 08/09/18 1519 (!) 147/101     Pulse Rate 08/09/18 1519 76     Resp 08/09/18 1519 18     Temp 08/09/18 1519 97.9 F (36.6 C)     Temp Source 08/09/18 1519 Oral     SpO2 08/09/18 1519 99 %     Weight 08/09/18 1520 298 lb (135.2 kg)     Height 08/09/18 1520 5\' 3"  (1.6 m)     Head Circumference --      Peak Flow --      Pain Score 08/09/18 1520 0     Pain Loc --      Pain Edu? --      Excl. in GC? --    No data found.  Updated Vital Signs BP (!) 147/101 (BP Location: Right Arm)   Pulse 76   Temp 97.9 F (36.6 C) (Oral)   Resp 18   Ht 5\' 3"  (1.6 m)   Wt 298 lb (135.2 kg)   LMP 08/09/2018   SpO2 99%   BMI 52.79 kg/m   Visual Acuity Right Eye Distance:   Left Eye Distance:   Bilateral Distance:    Right Eye Near:   Left Eye Near:  Bilateral Near:     Physical Exam Vitals signs and nursing note reviewed.  Constitutional:      Appearance: Normal appearance. She is well-developed.  HENT:     Head: Normocephalic and atraumatic.     Right Ear: Tympanic membrane normal.     Left Ear: Tympanic membrane normal.     Nose: Nose normal.     Right Sinus: No maxillary sinus tenderness or frontal sinus tenderness.     Left Sinus: No maxillary sinus tenderness or frontal sinus tenderness.     Mouth/Throat:     Lips: Pink.     Mouth: Mucous membranes are moist.     Pharynx: Oropharynx is clear. Uvula midline. No pharyngeal swelling, oropharyngeal exudate, posterior oropharyngeal erythema or uvula swelling.  Neck:     Musculoskeletal: Normal range of motion.  Cardiovascular:     Rate and Rhythm: Normal rate and regular rhythm.  Pulmonary:     Effort: Pulmonary effort is normal.     Breath sounds: Normal breath sounds. No stridor. No wheezing or rhonchi.  Musculoskeletal: Normal range of motion.   Skin:    General: Skin is warm and dry.  Neurological:     Mental Status: She is alert and oriented to person, place, and time.  Psychiatric:        Behavior: Behavior normal.      UC Treatments / Results  Labs (all labs ordered are listed, but only abnormal results are displayed) Labs Reviewed - No data to display  EKG None  Radiology No results found.  Procedures Procedures (including critical care time)  Medications Ordered in UC Medications - No data to display  Initial Impression / Assessment and Plan / UC Course  I have reviewed the triage vital signs and the nursing notes.  Pertinent labs & imaging results that were available during my care of the patient were reviewed by me and considered in my medical decision making (see chart for details).     Hx and exam c/w URI, likely viral Encouraged to try tessalon first May fill antibiotic and start taking if not improving with cough medication, of if pt develops a fever.   Final Clinical Impressions(s) / UC Diagnoses   Final diagnoses:  Upper respiratory tract infection, unspecified type     Discharge Instructions      Please take antibiotics as prescribed and be sure to complete entire course even if you start to feel better to ensure infection does not come back.  Please follow up with family medicine in 1 week if not improving.    ED Prescriptions    Medication Sig Dispense Auth. Provider   benzonatate (TESSALON) 100 MG capsule Take 1-2 capsules (100-200 mg total) by mouth every 8 (eight) hours. 21 capsule Doroteo GlassmanPhelps, Micca Matura O, PA-C   azithromycin (ZITHROMAX) 250 MG tablet Take 1 tablet (250 mg total) by mouth daily. Take first 2 tablets together, then 1 every day until finished. 6 tablet Lurene ShadowPhelps, Jeoffrey Eleazer O, PA-C     Controlled Substance Prescriptions [place] Controlled Substance Registry consulted? Not Applicable   Rolla Platehelps, Olin Gurski O, PA-C 08/09/18 [id number]

## 2018-08-09 NOTE — Discharge Instructions (Signed)
°  Please take antibiotics as prescribed and be sure to complete entire course even if you start to feel better to ensure infection does not come back. ° °Please follow up with family medicine in 1 week if not improving.  °

## 2020-04-22 ENCOUNTER — Other Ambulatory Visit: Payer: Managed Care, Other (non HMO)

## 2020-04-22 ENCOUNTER — Other Ambulatory Visit: Payer: Self-pay

## 2020-04-22 DIAGNOSIS — Z20822 Contact with and (suspected) exposure to covid-19: Secondary | ICD-10-CM

## 2020-04-23 LAB — SARS-COV-2, NAA 2 DAY TAT

## 2020-04-23 LAB — NOVEL CORONAVIRUS, NAA: SARS-CoV-2, NAA: NOT DETECTED

## 2020-04-23 LAB — SPECIMEN STATUS REPORT

## 2020-05-02 ENCOUNTER — Other Ambulatory Visit: Payer: Managed Care, Other (non HMO)

## 2020-05-02 ENCOUNTER — Other Ambulatory Visit: Payer: Self-pay | Admitting: Internal Medicine

## 2020-05-02 DIAGNOSIS — Z20822 Contact with and (suspected) exposure to covid-19: Secondary | ICD-10-CM

## 2020-05-03 LAB — SARS-COV-2, NAA 2 DAY TAT

## 2020-05-03 LAB — NOVEL CORONAVIRUS, NAA: SARS-CoV-2, NAA: NOT DETECTED

## 2020-07-24 ENCOUNTER — Ambulatory Visit: Payer: Managed Care, Other (non HMO) | Attending: Internal Medicine

## 2020-07-24 DIAGNOSIS — Z23 Encounter for immunization: Secondary | ICD-10-CM

## 2020-07-24 NOTE — Progress Notes (Signed)
° °  Covid-19 Vaccination Clinic  Name:  Candiss Galeana    MRN: [id number] DOB: 12/26/1980  07/24/2020  Ms. Turk was observed post Covid-19 immunization for 15 minutes without incident. She was provided with Vaccine Information Sheet and instruction to access the V-Safe system.   Ms. Przybylski was instructed to call 911 with any severe reactions post vaccine:  Difficulty breathing   Swelling of face and throat   A fast heartbeat   A bad rash all over body   Dizziness and weakness   Immunizations Administered    Name Date Dose VIS Date Route   Pfizer COVID-19 Vaccine 07/24/2020  2:49 PM 0.3 mL 05/28/2020 Intramuscular   Manufacturer: [employer], [employer]   Lot: [id number]   NDC: [address]-3

## 2020-07-30 ENCOUNTER — Other Ambulatory Visit: Payer: Managed Care, Other (non HMO)

## 2020-07-30 DIAGNOSIS — Z20822 Contact with and (suspected) exposure to covid-19: Secondary | ICD-10-CM

## 2020-07-31 ENCOUNTER — Other Ambulatory Visit: Payer: Managed Care, Other (non HMO)

## 2020-08-01 LAB — SARS-COV-2, NAA 2 DAY TAT

## 2020-08-01 LAB — NOVEL CORONAVIRUS, NAA: SARS-CoV-2, NAA: NOT DETECTED

## 2020-08-04 ENCOUNTER — Other Ambulatory Visit: Payer: Managed Care, Other (non HMO)

## 2020-08-13 ENCOUNTER — Other Ambulatory Visit: Payer: Managed Care, Other (non HMO)

## 2020-08-13 DIAGNOSIS — Z20822 Contact with and (suspected) exposure to covid-19: Secondary | ICD-10-CM

## 2020-08-15 LAB — NOVEL CORONAVIRUS, NAA: SARS-CoV-2, NAA: NOT DETECTED

## 2020-08-15 LAB — SARS-COV-2, NAA 2 DAY TAT

## 2020-12-18 ENCOUNTER — Ambulatory Visit: Payer: Managed Care, Other (non HMO) | Attending: Internal Medicine

## 2020-12-18 DIAGNOSIS — Z20822 Contact with and (suspected) exposure to covid-19: Secondary | ICD-10-CM

## 2020-12-20 LAB — SARS-COV-2, NAA 2 DAY TAT

## 2020-12-20 LAB — NOVEL CORONAVIRUS, NAA: SARS-CoV-2, NAA: NOT DETECTED

## 2021-04-30 ENCOUNTER — Telehealth: Payer: Managed Care, Other (non HMO) | Admitting: Physician Assistant

## 2021-04-30 DIAGNOSIS — U071 COVID-19: Secondary | ICD-10-CM | POA: Diagnosis not present

## 2021-04-30 MED ORDER — BENZONATATE 100 MG PO CAPS: 100.0000 mg | ORAL_CAPSULE | Freq: Three times a day (TID) | ORAL | 0 refills | Status: DC | PRN

## 2021-04-30 MED ORDER — AZITHROMYCIN 250 MG PO TABS: ORAL_TABLET | ORAL | 0 refills | Status: AC

## 2021-04-30 NOTE — Patient Instructions (Signed)
Robin Hopkins, thank you for joining [name], PA-C for today's virtual visit.  While this provider is not your primary care provider (PCP), if your PCP is located in our provider database this encounter information will be shared with them immediately following your visit.  Consent: (Patient) Robin Hopkins provided verbal consent for this virtual visit at the beginning of the encounter.  Current Medications:  Current Outpatient Medications:    azithromycin (ZITHROMAX) 250 MG tablet, Take 1 tablet (250 mg total) by mouth daily. Take first 2 tablets together, then 1 every day until finished., Disp: 6 tablet, Rfl: 0   benzonatate (TESSALON) 100 MG capsule, Take 1-2 capsules (100-200 mg total) by mouth every 8 (eight) hours., Disp: 21 capsule, Rfl: 0   Medications ordered in this encounter:  No orders of the defined types were placed in this encounter.    *If you need refills on other medications prior to your next appointment, please contact your pharmacy*  Follow-Up: Call back or seek an in-person evaluation if the symptoms worsen or if the condition fails to improve as anticipated.  Other Instructions Please keep well-hydrated and get plenty of rest. Start a saline nasal rinse to flush out your nasal passages. You can use plain Mucinex to help thin congestion. If you have a humidifier, running in the bedroom at night. I want you to start OTC vitamin D3 1000 units daily, vitamin C 1000 mg daily, and a zinc supplement. Please take prescribed medications as directed.  You have been enrolled in a MyChart symptom monitoring program. Please answer these questions daily so we can keep track of how you are doing.  You were to quarantine for 5 days from onset of your symptoms.  After day 5, if you have had no fever and you are feeling better, you can end quarantine but need to mask for an additional 5 days. After day 5 if you have a fever or are having significant symptoms,  please quarantine for full 10 days.  If you note any worsening of symptoms, any significant shortness of breath or any chest pain, please seek ER evaluation ASAP.  Please do not delay care!  COVID-19: What to Do if You Are Sick If you test positive and are an older adult or someone who is at high risk of getting very sick from COVID-19, treatment may be available. Contact a healthcare provider right away after a positive test to determine if you are eligible, even if your symptoms are mild right now. You can also visit a Test to Treat location and, if eligible, receive a prescription from a provider. Don't delay: Treatment must be started within the first few days to be effective. If you have a fever, cough, or other symptoms, you might have COVID-19. Most people have mild illness and are able to recover at home. If you are sick: Keep track of your symptoms. If you have an emergency warning sign (including trouble breathing), call 911. Steps to help prevent the spread of COVID-19 if you are sick If you are sick with COVID-19 or think you might have COVID-19, follow the steps below to care for yourself and to help protect other people in your home and community. Stay home except to get medical care Stay home. Most people with COVID-19 have mild illness and can recover at home without medical care. Do not leave your home, except to get medical care. Do not visit public areas and do not go to places where you are unable  to wear a mask. Take care of yourself. Get rest and stay hydrated. Take over-the-counter medicines, such as acetaminophen, to help you feel better. Stay in touch with your doctor. Call before you get medical care. Be sure to get care if you have trouble breathing, or have any other emergency warning signs, or if you think it is an emergency. Avoid public transportation, ride-sharing, or taxis if possible. Get tested If you have symptoms of COVID-19, get tested. While waiting for test  results, stay away from others, including staying apart from those living in your household. Get tested as soon as possible after your symptoms start. Treatments may be available for people with COVID-19 who are at risk for becoming very sick. Don't delay: Treatment must be started early to be effective--some treatments must begin within 5 days of your first symptoms. Contact your healthcare provider right away if your test result is positive to determine if you are eligible. Self-tests are one of several options for testing for the virus that causes COVID-19 and may be more convenient than laboratory-based tests and point-of-care tests. Ask your healthcare provider or your local health department if you need help interpreting your test results. You can visit your state, tribal, local, and territorial health department's website to look for the latest local information on testing sites. Separate yourself from other people As much as possible, stay in a specific room and away from other people and pets in your home. If possible, you should use a separate bathroom. If you need to be around other people or animals in or outside of the home, wear a well-fitting mask. Tell your close contacts that they may have been exposed to COVID-19. An infected person can spread COVID-19 starting 48 hours (or 2 days) before the person has any symptoms or tests positive. By letting your close contacts know they may have been exposed to COVID-19, you are helping to protect everyone. See COVID-19 and Animals if you have questions about pets. If you are diagnosed with COVID-19, someone from the health department may call you. Answer the call to slow the spread. Monitor your symptoms Symptoms of COVID-19 include fever, cough, or other symptoms. Follow care instructions from your healthcare provider and local health department. Your local health authorities may give instructions on checking your symptoms and reporting  information. When to seek emergency medical attention Look for emergency warning signs* for COVID-19. If someone is showing any of these signs, seek emergency medical care immediately: Trouble breathing Persistent pain or pressure in the chest New confusion Inability to wake or stay awake Pale, gray, or blue-colored skin, lips, or nail beds, depending on skin tone *This list is not all possible symptoms. Please call your medical provider for any other symptoms that are severe or concerning to you. Call 911 or call ahead to your local emergency facility: Notify the operator that you are seeking care for someone who has or may have COVID-19. Call ahead before visiting your doctor Call ahead. Many medical visits for routine care are being postponed or done by phone or telemedicine. If you have a medical appointment that cannot be postponed, call your doctor's office, and tell them you have or may have COVID-19. This will help the office protect themselves and other patients. If you are sick, wear a well-fitting mask You should wear a mask if you must be around other people or animals, including pets (even at home). Wear a mask with the best fit, protection, and comfort for you. You  don't need to wear the mask if you are alone. If you can't put on a mask (because of trouble breathing, for example), cover your coughs and sneezes in some other way. Try to stay at least 6 feet away from other people. This will help protect the people around you. Masks should not be placed on young children under age 76 years, anyone who has trouble breathing, or anyone who is not able to remove the mask without help. Cover your coughs and sneezes Cover your mouth and nose with a tissue when you cough or sneeze. Throw away used tissues in a lined trash can. Immediately wash your hands with soap and water for at least 20 seconds. If soap and water are not available, clean your hands with an alcohol-based hand sanitizer  that contains at least 60% alcohol. Clean your hands often Wash your hands often with soap and water for at least 20 seconds. This is especially important after blowing your nose, coughing, or sneezing; going to the bathroom; and before eating or preparing food. Use hand sanitizer if soap and water are not available. Use an alcohol-based hand sanitizer with at least 60% alcohol, covering all surfaces of your hands and rubbing them together until they feel dry. Soap and water are the best option, especially if hands are visibly dirty. Avoid touching your eyes, nose, and mouth with unwashed hands. Handwashing Tips Avoid sharing personal household items Do not share dishes, drinking glasses, cups, eating utensils, towels, or bedding with other people in your home. Wash these items thoroughly after using them with soap and water or put in the dishwasher. Clean surfaces in your home regularly Clean and disinfect high-touch surfaces (for example, doorknobs, tables, handles, light switches, and countertops) in your "sick room" and bathroom. In shared spaces, you should clean and disinfect surfaces and items after each use by the person who is ill. If you are sick and cannot clean, a caregiver or other person should only clean and disinfect the area around you (such as your bedroom and bathroom) on an as needed basis. Your caregiver/other person should wait as long as possible (at least several hours) and wear a mask before entering, cleaning, and disinfecting shared spaces that you use. Clean and disinfect areas that may have blood, stool, or body fluids on them. Use household cleaners and disinfectants. Clean visible dirty surfaces with household cleaners containing soap or detergent. Then, use a household disinfectant. Use a product from [employer] List N: Disinfectants for Coronavirus (COVID-19). Be sure to follow the instructions on the label to ensure safe and effective use of the product. Many products  recommend keeping the surface wet with a disinfectant for a certain period of time (look at "contact time" on the product label). You may also need to wear personal protective equipment, such as gloves, depending on the directions on the product label. Immediately after disinfecting, wash your hands with soap and water for 20 seconds. For completed guidance on cleaning and disinfecting your home, visit Complete Disinfection Guidance. Take steps to improve ventilation at home Improve ventilation (air flow) at home to help prevent from spreading COVID-19 to other people in your household. Clear out COVID-19 virus particles in the air by opening windows, using air filters, and turning on fans in your home. Use this interactive tool to learn how to improve air flow in your home. When you can be around others after being sick with COVID-19 Deciding when you can be around others is different for different  situations. Find out when you can safely end home isolation. For any additional questions about your care, contact your healthcare provider or state or local health department. 10/28/2020 Content source: [facility] ([facility]), Division of Viral Diseases This information is not intended to replace advice given to you by your health care provider. Make sure you discuss any questions you have with your health care provider. Document Revised: 12/11/2020 Document Reviewed: 12/11/2020 Elsevier Patient Education  2022 [employer].      If you have been instructed to have an in-person evaluation today at a local Urgent Care facility, please use the link below. It will take you to a list of all of our available [facility], including address, phone number and hours of operation. Please do not delay care.  [facility]  If you or a family member do not have a primary care provider, use the link below to schedule a visit and establish  care. When you choose a [facility] primary care physician or advanced practice provider, you gain a long-term partner in health. Find a Primary Care Provider  Learn more about [facility]'s in-office and virtual care options: [facility] - Get Care Now

## 2021-04-30 NOTE — Progress Notes (Signed)
Virtual Visit Consent   Robin Hopkins, you are scheduled for a virtual visit with a [facility] provider today.     Just as with appointments in the office, your consent must be obtained to participate.  Your consent will be active for this visit and any virtual visit you may have with one of our providers in the next 365 days.     If you have a MyChart account, a copy of this consent can be sent to you electronically.  All virtual visits are billed to your insurance company just like a traditional visit in the office.    As this is a virtual visit, video technology does not allow for your provider to perform a traditional examination.  This may limit your provider's ability to fully assess your condition.  If your provider identifies any concerns that need to be evaluated in person or the need to arrange testing (such as labs, EKG, etc.), we will make arrangements to do so.     Although advances in technology are sophisticated, we cannot ensure that it will always work on either your end or our end.  If the connection with a video visit is poor, the visit may have to be switched to a telephone visit.  With either a video or telephone visit, we are not always able to ensure that we have a secure connection.     I need to obtain your verbal consent now.   Are you willing to proceed with your visit today?    Maigen Mozingo has provided verbal consent on 04/30/2021 for a virtual visit (video or telephone).   Robin Hopkins, [place]   Date: 04/30/2021 7:36 AM   Virtual Visit via Video Note   I, Robin Hopkins, connected with  Kseniya Grunden  ([id number], 01/25/1981) on 04/30/21 at  7:30 AM EDT by a video-enabled telemedicine application and verified that I am speaking with the correct person using two identifiers.  Location: Patient: Virtual Visit Location Patient: Home Provider: Virtual Visit Location Provider: Home Office   I discussed the limitations of evaluation and management  by telemedicine and the availability of in person appointments. The patient expressed understanding and agreed to proceed.    History of Present Illness: Robin Hopkins is a 40 y.o. who identifies as a female who was assigned female at birth, and is being seen today for + COVID-19.  Notes symptoms starting last Thursday night into Friday morning with some mild fatigue and cold symptoms. Symptoms continue to progress with more significant fatigue, chest congestion and cough. Took multiple COVID tests which were negative up until last test yesterday which came back positive. Cough is now more significant with increased chest congestion and now cough is productive. Sinus symptoms are much improved from onset but the chest congestion is worsening. Has been taking Alka Seltzer Cold/Sinus. Robin Hopkins   HPI: HPI  Problems: There are no problems to display for this patient.   Allergies: No Known Allergies Medications:  Current Outpatient Medications:    Multiple Vitamins-Minerals (AIRBORNE GUMMIES PO), Take by mouth., Disp: , Rfl:   Observations/Objective: Patient is well-developed, well-nourished in no acute distress.  Resting comfortably at home.  Head is normocephalic, atraumatic.  No labored breathing. Speech is clear and coherent with logical content.  Patient is alert and oriented at baseline.   Assessment and Plan: 1. COVID-19 Milder symptoms. Lower risk of complications - risk score of 1. No indication for antiviral at present time as risk of ADR from  medication > likely benefit. And she is outside the window for antiviral treatments. Supportive measures, OTC medications and Vitamin regimen reviewed. Patient enrolled in COVID monitoring program through MyChart. Strict ER precautions reviewed. Some concern for secondary bronchitis giving her medical history and current symptoms.    Follow Up Instructions: I discussed the assessment and treatment plan with the patient. The patient was provided an  opportunity to ask questions and all were answered. The patient agreed with the plan and demonstrated an understanding of the instructions.  A copy of instructions were sent to the patient via MyChart.  The patient was advised to call back or seek an in-person evaluation if the symptoms worsen or if the condition fails to improve as anticipated.  Time:  I spent 17 minutes with the patient via telehealth technology discussing the above problems/concerns.    Robin Climes, PA-C

## 2021-06-03 ENCOUNTER — Emergency Department (INDEPENDENT_AMBULATORY_CARE_PROVIDER_SITE_OTHER)
Admission: EM | Admit: 2021-06-03 | Discharge: 2021-06-03 | Disposition: A | Discharge status: Home / Self Care | Payer: Managed Care, Other (non HMO) | Source: Home / Self Care

## 2021-06-03 ENCOUNTER — Emergency Department: Admit: 2021-06-03 | Discharge status: Absent without leave | Payer: Self-pay

## 2021-06-03 ENCOUNTER — Encounter: Payer: Self-pay | Admitting: Emergency Medicine

## 2021-06-03 ENCOUNTER — Other Ambulatory Visit: Payer: Self-pay

## 2021-06-03 DIAGNOSIS — J309 Allergic rhinitis, unspecified: Secondary | ICD-10-CM

## 2021-06-03 DIAGNOSIS — R059 Cough, unspecified: Secondary | ICD-10-CM

## 2021-06-03 DIAGNOSIS — B349 Viral infection, unspecified: Secondary | ICD-10-CM | POA: Diagnosis not present

## 2021-06-03 DIAGNOSIS — R509 Fever, unspecified: Secondary | ICD-10-CM

## 2021-06-03 MED ORDER — PREDNISONE 20 MG PO TABS: ORAL_TABLET | ORAL | 0 refills | Status: DC

## 2021-06-03 MED ORDER — FEXOFENADINE HCL 180 MG PO TABS: 180.0000 mg | ORAL_TABLET | Freq: Every day | ORAL | 0 refills | Status: DC

## 2021-06-03 NOTE — Discharge Instructions (Addendum)
Advised patient to take Prednisone as directed with food to completion.  Advised patient to take Allegra daily for the next 5-7 days for concurrent postnasal drainage/drip.  Advised patient may alternate OTC Tylenol 1000 mg 1-2 times daily, as needed with OTC Ibuprofen 800 mg 1-2 times daily, as needed.  Encouraged patient increase daily water intake and that we would follow-up with COVID-19, fluA/B results once received.

## 2021-06-03 NOTE — ED Triage Notes (Signed)
Cough, weakness, body aches, diarrhea, fever 102.4, runny nose, exposure to Influenza. Just had Covid 04/29/21. Vaccinated for [employer]

## 2021-06-03 NOTE — ED Provider Notes (Signed)
[name]    CSN: [id number] Arrival date & time: 06/03/21  1005      History   Chief Complaint No chief complaint on file.   HPI Robin Hopkins is a 40 y.o. female.   HPI 40 year old female presents with cough, weakness, body aches, diarrhea, fever 102.4 runny nose, exposure to influenza.  Past Medical History:  Diagnosis Date   Hypertension     There are no problems to display for this patient.   Past Surgical History:  Procedure Laterality Date   CESAREAN SECTION     CHOLECYSTECTOMY      OB History   No obstetric history on file.      Home Medications    Prior to Admission medications   Medication Sig Start Date End Date Taking? Authorizing Provider  fexofenadine (ALLEGRA ALLERGY) 180 MG tablet Take 1 tablet (180 mg total) by mouth daily for 15 days. 06/03/21 06/18/21 Yes Trevor Iha, FNP  predniSONE (DELTASONE) 20 MG tablet Take 3 tabs PO x 5 days. 06/03/21  Yes Trevor Iha, FNP  vitamin C (ASCORBIC ACID) 500 MG tablet Take 500 mg by mouth daily.   Yes [provider]  Multiple Vitamins-Minerals (AIRBORNE GUMMIES PO) Take by mouth.    [provider]    Family History Family History  Problem Relation Age of Onset   Hypertension Mother    COPD Mother    Diabetes Mother    Heart attack Father    Diabetes Father     Social History Social History   Tobacco Use   Smoking status: Former   Smokeless tobacco: Never  Building services engineer Use: Never used  Substance Use Topics   Alcohol use: Yes   Drug use: No     Allergies   Patient has no known allergies.   Review of Systems Review of Systems  Constitutional:  Positive for chills, fatigue and fever.  HENT:  Positive for congestion and sore throat.   Gastrointestinal:  Positive for diarrhea.    Physical Exam Triage Vital Signs ED Triage Vitals  Enc Vitals Group     BP 06/03/21 1053 (!) 149/102     Pulse Rate 06/03/21 1053 (!) 114     Resp  06/03/21 1053 (!) 22     Temp 06/03/21 1053 (!) 102.4 F (39.1 C)     Temp Source 06/03/21 1053 Oral     SpO2 06/03/21 1053 97 %     Weight --      Height --      Head Circumference --      Peak Flow --      Pain Score 06/03/21 1055 8     Pain Loc --      Pain Edu? --      Excl. in GC? --    No data found.  Updated Vital Signs BP (!) 149/102 (BP Location: Left Arm) Comment: Does not have PCP  Pulse (!) 114   Temp (!) 102.4 F (39.1 C) (Oral)   Resp (!) 22   Ht 5\' 4"  (1.626 m)   Wt 295 lb (133.8 kg)   SpO2 97%   BMI 50.64 kg/m      Physical Exam Vitals and nursing note reviewed.  Constitutional:      General: She is not in acute distress.    Appearance: She is obese. She is ill-appearing. She is not toxic-appearing.  HENT:     Head: Normocephalic and atraumatic.  Right Ear: Tympanic membrane, ear canal and external ear normal.     Left Ear: Tympanic membrane, ear canal and external ear normal.     Mouth/Throat:     Mouth: Mucous membranes are moist.     Pharynx: Oropharynx is clear.  Eyes:     Extraocular Movements: Extraocular movements intact.     Conjunctiva/sclera: Conjunctivae normal.     Pupils: Pupils are equal, round, and reactive to light.  Cardiovascular:     Rate and Rhythm: Normal rate and regular rhythm.  Pulmonary:     Effort: Pulmonary effort is normal.     Breath sounds: Normal breath sounds.     Comments: NO Adventitious breath sounds noted Musculoskeletal:        General: Normal range of motion.     Cervical back: Normal range of motion and neck supple.  Skin:    General: Skin is warm and dry.  Neurological:     General: No focal deficit present.     Mental Status: She is alert and oriented to person, place, and time.  Psychiatric:        Mood and Affect: Mood normal.        Behavior: Behavior normal.     UC Treatments / Results  Labs (all labs ordered are listed, but only abnormal results are displayed) Labs Reviewed   COVID-19, FLU A+B NAA    EKG   Radiology No results found.  Procedures Procedures (including critical care time)  Medications Ordered in UC Medications - No data to display  Initial Impression / Assessment and Plan / UC Course  I have reviewed the triage vital signs and the nursing notes.  Pertinent labs & imaging results that were available during my care of the patient were reviewed by me and considered in my medical decision making (see chart for details).     MDM: 1.  Fever-1000 mg Tylenol given p.o. in clinic prior to discharge today. Advised patient may alternate OTC Tylenol 1000 mg 1-2 times daily, as needed with OTC Ibuprofen 800 mg 1-2 times daily, as needed.  Encouraged patient increase daily water intake and that we would follow-up with COVID-19, flu/B results once received; 2.  Allergic rhinitis-Rx'd Allegra; 3.  Cough Rx'd prednisone burst. Encouraged patient increase daily water intake and that we would follow-up with COVID-19, flu/B results once received.  Patient discharged home, hemodynamically stable. Final Clinical Impressions(s) / UC Diagnoses   Final diagnoses:  Fever, unspecified  Viral illness  Cough, unspecified type  Allergic rhinitis, unspecified seasonality, unspecified trigger     Discharge Instructions      Advised patient to take Prednisone as directed with food to completion.  Advised patient to take Allegra daily for the next 5-7 days for concurrent postnasal drainage/drip.  Advised patient may alternate OTC Tylenol 1000 mg 1-2 times daily, as needed with OTC Ibuprofen 800 mg 1-2 times daily, as needed.  Encouraged patient increase daily water intake and that we would follow-up with COVID-19, fluA/B results once received.     ED Prescriptions     Medication Sig Dispense Auth. Provider   predniSONE (DELTASONE) 20 MG tablet Take 3 tabs PO x 5 days. 15 tablet Trevor Iha, FNP   fexofenadine [facility] ALLERGY) 180 MG tablet Take 1 tablet  (180 mg total) by mouth daily for 15 days. 15 tablet Trevor Iha, FNP      PDMP not reviewed this encounter.   Trevor Iha, FNP 06/03/21 1133

## 2021-06-05 LAB — COVID-19, FLU A+B NAA
Influenza A, NAA: DETECTED — AB
Influenza B, NAA: NOT DETECTED
SARS-CoV-2, NAA: NOT DETECTED

## 2021-06-08 ENCOUNTER — Encounter: Payer: Self-pay | Admitting: Physician Assistant

## 2021-06-08 ENCOUNTER — Telehealth: Payer: Managed Care, Other (non HMO) | Admitting: Physician Assistant

## 2021-06-08 DIAGNOSIS — J111 Influenza due to unidentified influenza virus with other respiratory manifestations: Secondary | ICD-10-CM

## 2021-06-08 MED ORDER — BENZONATATE 100 MG PO CAPS: 100.0000 mg | ORAL_CAPSULE | Freq: Three times a day (TID) | ORAL | 0 refills | Status: AC

## 2021-06-08 MED ORDER — ALBUTEROL SULFATE HFA 108 (90 BASE) MCG/ACT IN AERS: 2.0000 | INHALATION_SPRAY | Freq: Four times a day (QID) | RESPIRATORY_TRACT | 0 refills | Status: DC | PRN

## 2021-06-08 NOTE — Progress Notes (Signed)
[name], Robin Hopkins are scheduled for a virtual visit with your provider today.    Just as we do with appointments in the office, we must obtain your consent to participate.  Your consent will be active for this visit and any virtual visit you may have with one of our providers in the next 365 days.    If you have a MyChart account, I can also send a copy of this consent to you electronically.  All virtual visits are billed to your insurance company just like a traditional visit in the office.  As this is a virtual visit, video technology does not allow for your provider to perform a traditional examination.  This may limit your provider's ability to fully assess your condition.  If your provider identifies any concerns that need to be evaluated in person or the need to arrange testing such as labs, EKG, etc, we will make arrangements to do so.    Although advances in technology are sophisticated, we cannot ensure that it will always work on either your end or our end.  If the connection with a video visit is poor, we may have to switch to a telephone visit.  With either a video or telephone visit, we are not always able to ensure that we have a secure connection.   I need to obtain your verbal consent now.   Are you willing to proceed with your visit today?   Robin Hopkins has provided verbal consent on 06/08/2021 for a virtual visit (video or telephone).   Robin Hopkins 06/08/2021  4:01 PM   Date:  06/08/2021   ID:  Robin Hopkins, DOB 21-Jan-1981, MRN [id number]  Patient Location: Home Provider Location: Home Office   Participants: Patient and Provider for Visit and Wrap up  Method of visit: Video  Location of Patient: Home Location of Provider: Home Office Consent was obtain for visit over the video. Services rendered by provider: Visit was performed via video  A video enabled telemedicine application was used and I verified that I am speaking with the correct person using two  identifiers.  PCP:  Pcp, No   Chief Complaint:  cough  History of Present Illness:    Robin Hopkins is a 40 y.o. female with history as stated below. Presents video telehealth for an acute care visit  Pt had covid 4 weeks ago. Last week on 10/26 she was seen at urgent care with covid and fever. At that time she was dx with flu. She has had tessalon at home and it has not improved symptoms. She denies fevers or persistent shortness of breath.  Past Medical, Surgical, Social History, Allergies, and Medications have been Reviewed.  Past Medical History:  Diagnosis Date   Hypertension     Current Meds  Medication Sig   albuterol (VENTOLIN HFA) 108 (90 Base) MCG/ACT inhaler Inhale 2 puffs into the lungs every 6 (six) hours as needed for wheezing or shortness of breath.   benzonatate (TESSALON) 100 MG capsule Take 1 capsule (100 mg total) by mouth every 8 (eight) hours for 5 days.     Allergies:   Patient has no known allergies.   ROS See HPI for history of present illness.  Physical Exam Vitals and nursing note reviewed.  Constitutional:      General: She is not in acute distress.    Appearance: She is well-developed.  HENT:     Head: Normocephalic and atraumatic.  Eyes:     Conjunctiva/sclera: Conjunctivae normal.  Pulmonary:     Effort: Pulmonary effort is normal.  Musculoskeletal:        General: Normal range of motion.     Cervical back: Neck supple.  Skin:    General: Skin is warm and dry.  Neurological:     Mental Status: She is alert.      MDM: c/o continued cough despite taking tessalon for her influenza that was diagnosed yesterday.  She recently finished prednisone. Low suspicion for bacterial pneumonia. Discussed that this is typical progression for flu. Have given rx for albuterol.        There are no diagnoses linked to this encounter.   Time:   Today, I have spent 20 minutes with the patient with telehealth technology discussing the above problems,  reviewing the chart, previous notes, medications and orders.    Tests Ordered: No orders of the defined types were placed in this encounter.   Medication Changes: Meds ordered this encounter  Medications   albuterol (VENTOLIN HFA) 108 (90 Base) MCG/ACT inhaler    Sig: Inhale 2 puffs into the lungs every 6 (six) hours as needed for wheezing or shortness of breath.    Dispense:  8 g    Refill:  0    Order Specific Question:   Supervising Provider    Answer:   MILLER, BRIAN [[id number]]   benzonatate (TESSALON) 100 MG capsule    Sig: Take 1 capsule (100 mg total) by mouth every 8 (eight) hours for 5 days.    Dispense:  15 capsule    Refill:  0    Order Specific Question:   Supervising Provider    Answer:   Eber Hong [[id number]]      Disposition:  Follow up  Signed, Robin Mcmahan Manfred Shirts, Hopkins  06/08/2021 4:01 PM

## 2021-06-08 NOTE — Patient Instructions (Signed)
  Robin Hopkins, thank you for joining Robin Meres, PA-C for today's virtual visit.  While this provider is not your primary care provider (PCP), if your PCP is located in our provider database this encounter information will be shared with them immediately following your visit.  Consent: (Patient) Robin Hopkins provided verbal consent for this virtual visit at the beginning of the encounter.  Current Medications:  Current Outpatient Medications:    fexofenadine (ALLEGRA ALLERGY) 180 MG tablet, Take 1 tablet (180 mg total) by mouth daily for 15 days., Disp: 15 tablet, Rfl: 0   Multiple Vitamins-Minerals (AIRBORNE GUMMIES PO), Take by mouth., Disp: , Rfl:    predniSONE (DELTASONE) 20 MG tablet, Take 3 tabs PO x 5 days., Disp: 15 tablet, Rfl: 0   vitamin C (ASCORBIC ACID) 500 MG tablet, Take 500 mg by mouth daily., Disp: , Rfl:    Medications ordered in this encounter:  No orders of the defined types were placed in this encounter.    *If you need refills on other medications prior to your next appointment, please contact your pharmacy*  Follow-Up: Call back or seek an in-person evaluation if the symptoms worsen or if the condition fails to improve as anticipated.  Other Instructions Tessalon and albuterol given, take as instructed.   Please follow up with your regular doctor in 1 week for reassessment and return for any new or worsening symptoms.   If you have been instructed to have an in-person evaluation today at a local Urgent Care facility, please use the link below. It will take you to a list of all of our available [facility], including address, phone number and hours of operation. Please do not delay care.  [facility]  If you or a family member do not have a primary care provider, use the link below to schedule a visit and establish care. When you choose a [facility] primary care physician or advanced practice provider, you gain a long-term  partner in health. Find a Primary Care Provider  Learn more about [facility]'s in-office and virtual care options: [facility] - Get Care Now

## 2021-08-13 ENCOUNTER — Emergency Department (HOSPITAL_BASED_OUTPATIENT_CLINIC_OR_DEPARTMENT_OTHER): Payer: Managed Care, Other (non HMO) | Admitting: Radiology

## 2021-08-13 ENCOUNTER — Emergency Department (HOSPITAL_BASED_OUTPATIENT_CLINIC_OR_DEPARTMENT_OTHER)
Admission: EM | Admit: 2021-08-13 | Discharge: 2021-08-14 | Disposition: A | Discharge status: Home / Self Care | Payer: Managed Care, Other (non HMO) | Attending: Emergency Medicine | Admitting: Emergency Medicine

## 2021-08-13 ENCOUNTER — Other Ambulatory Visit: Payer: Self-pay

## 2021-08-13 ENCOUNTER — Encounter (HOSPITAL_BASED_OUTPATIENT_CLINIC_OR_DEPARTMENT_OTHER): Payer: Self-pay | Admitting: Emergency Medicine

## 2021-08-13 ENCOUNTER — Emergency Department (INDEPENDENT_AMBULATORY_CARE_PROVIDER_SITE_OTHER)
Admission: EM | Admit: 2021-08-13 | Discharge: 2021-08-13 | Disposition: A | Discharge status: Home / Self Care | Payer: Managed Care, Other (non HMO) | Source: Home / Self Care

## 2021-08-13 DIAGNOSIS — M25512 Pain in left shoulder: Secondary | ICD-10-CM | POA: Insufficient documentation

## 2021-08-13 DIAGNOSIS — R079 Chest pain, unspecified: Secondary | ICD-10-CM

## 2021-08-13 DIAGNOSIS — R0789 Other chest pain: Secondary | ICD-10-CM | POA: Insufficient documentation

## 2021-08-13 DIAGNOSIS — R030 Elevated blood-pressure reading, without diagnosis of hypertension: Secondary | ICD-10-CM | POA: Insufficient documentation

## 2021-08-13 LAB — CBC
HCT: 38 % (ref 36.0–46.0)
Hemoglobin: 11.6 g/dL — ABNORMAL LOW (ref 12.0–15.0)
MCH: 24.4 pg — ABNORMAL LOW (ref 26.0–34.0)
MCHC: 30.5 g/dL (ref 30.0–36.0)
MCV: 79.8 fL — ABNORMAL LOW (ref 80.0–100.0)
Platelets: 371 10*3/uL (ref 150–400)
RBC: 4.76 MIL/uL (ref 3.87–5.11)
RDW: 16.4 % — ABNORMAL HIGH (ref 11.5–15.5)
WBC: 9.6 10*3/uL (ref 4.0–10.5)
nRBC: 0 % (ref 0.0–0.2)

## 2021-08-13 LAB — COMPREHENSIVE METABOLIC PANEL
ALT: 13 U/L (ref 0–44)
AST: 11 U/L — ABNORMAL LOW (ref 15–41)
Albumin: 4.2 g/dL (ref 3.5–5.0)
Alkaline Phosphatase: 82 U/L (ref 38–126)
Anion gap: 11 (ref 5–15)
BUN: 9 mg/dL (ref 6–20)
CO2: 22 mmol/L (ref 22–32)
Calcium: 9 mg/dL (ref 8.9–10.3)
Chloride: 105 mmol/L (ref 98–111)
Creatinine, Ser: 0.76 mg/dL (ref 0.44–1.00)
GFR, Estimated: >60 mL/min (ref >60)
Glucose, Bld: 85 mg/dL (ref 70–99)
Potassium: 3.7 mmol/L (ref 3.5–5.1)
Sodium: 138 mmol/L (ref 135–145)
Total Bilirubin: 1 mg/dL (ref 0.3–1.2)
Total Protein: 7.7 g/dL (ref 6.5–8.1)

## 2021-08-13 LAB — TROPONIN I (HIGH SENSITIVITY): Troponin I (High Sensitivity): <2 ng/L (ref <18)

## 2021-08-13 NOTE — ED Triage Notes (Signed)
Pain in left shoulder , rads to chest and  back shoulder blade hurts to take a deep breath , pressure

## 2021-08-13 NOTE — ED Provider Notes (Signed)
[name]    CSN: [id number] Arrival date & time: 08/13/21  1420      History   Chief Complaint Chief Complaint  Patient presents with   Chest Pain   Back Pain    Upper back and LT shoulder    HPI Robin Hopkins is a 41 y.o. female.   HPI 41 year old female presents with chest pain and chest pressure since yesterday evening first. Reports started out as a upper back/left shoulder pain, then chest pain under left breast started.  Patient reports family history of cardiac problems.  Patiently is not currently followed by PCP with no recent serological baseline labs available.  Past Medical History:  Diagnosis Date   Hypertension     There are no problems to display for this patient.   Past Surgical History:  Procedure Laterality Date   CESAREAN SECTION     CHOLECYSTECTOMY      OB History   No obstetric history on file.      Home Medications    Prior to Admission medications   Medication Sig Start Date End Date Taking? Authorizing Provider  albuterol (VENTOLIN HFA) 108 (90 Base) MCG/ACT inhaler Inhale 2 puffs into the lungs every 6 (six) hours as needed for wheezing or shortness of breath. 06/08/21   Couture, Cortni S, PA-C  fexofenadine (ALLEGRA ALLERGY) 180 MG tablet Take 1 tablet (180 mg total) by mouth daily for 15 days. 06/03/21 06/18/21  Trevor Iha, FNP  Multiple Vitamins-Minerals (AIRBORNE GUMMIES PO) Take by mouth.    [provider]  predniSONE (DELTASONE) 20 MG tablet Take 3 tabs PO x 5 days. 06/03/21   Trevor Iha, FNP  vitamin C (ASCORBIC ACID) 500 MG tablet Take 500 mg by mouth daily.    [provider]    Family History Family History  Problem Relation Age of Onset   Hypertension Mother    COPD Mother    Diabetes Mother    Heart attack Father    Diabetes Father     Social History Social History   Tobacco Use   Smoking status: Former   Smokeless tobacco: Never  Building services engineer Use: Never used   Substance Use Topics   Alcohol use: Yes   Drug use: No     Allergies   Patient has no known allergies.   Review of Systems Review of Systems  Cardiovascular:  Positive for chest pain.  Musculoskeletal:  Positive for back pain.  All other systems reviewed and are negative.   Physical Exam Triage Vital Signs ED Triage Vitals  Enc Vitals Group     BP 08/13/21 1427 (!) 144/106     Pulse Rate 08/13/21 1427 82     Resp 08/13/21 1427 17     Temp 08/13/21 1427 98.6 F (37 C)     Temp Source 08/13/21 1427 Oral     SpO2 08/13/21 1427 97 %     Weight --      Height --      Head Circumference --      Peak Flow --      Pain Score 08/13/21 1433 2     Pain Loc --      Pain Edu? --      Excl. in GC? --    No data found.  Updated Vital Signs BP (!) 144/106 (BP Location: Right Wrist)    Pulse 82    Temp 98.6 F (37 C) (Oral)    Resp 17  SpO2 97%      Physical Exam Vitals and nursing note reviewed.  Constitutional:      General: She is not in acute distress.    Appearance: She is obese. She is not ill-appearing.  Eyes:     Extraocular Movements: Extraocular movements intact.     Pupils: Pupils are equal, round, and reactive to light.  Neck:     Vascular: No JVD.     Comments: No JVD, no bruit Cardiovascular:     Rate and Rhythm: Normal rate and regular rhythm.     Pulses:          Carotid pulses are 0 on the right side and 0 on the left side.      Posterior tibial pulses are 2+ on the right side and 2+ on the left side.     Heart sounds: Normal heart sounds.    No friction rub. No gallop.  Pulmonary:     Effort: Pulmonary effort is normal. No tachypnea or respiratory distress.     Breath sounds: Normal breath sounds. No stridor.  Musculoskeletal:        General: Normal range of motion.     Cervical back: Normal range of motion and neck supple.  Skin:    General: Skin is warm and dry.  Neurological:     General: No focal deficit present.     Mental Status: She  is alert and oriented to person, place, and time.     UC Treatments / Results  Labs (all labs ordered are listed, but only abnormal results are displayed) Labs Reviewed - No data to display  EKG   Radiology No results found.  Procedures Procedures (including critical care time)  Medications Ordered in UC Medications - No data to display  Initial Impression / Assessment and Plan / UC Course  I have reviewed the triage vital signs and the nursing notes.  Pertinent labs & imaging results that were available during my care of the patient were reviewed by me and considered in my medical decision making (see chart for details).     MDM: 1.  Chest pain, unspecified type, EKG revealed normal sinus rhythm with no other EKG and medical records to compare. Advised patient given family history, current symptoms, specifically mild chest pressure radiating to left posterior shoulder; and lack of serological testing for nearly 12 years, I have encouraged/advised patient to go to [facility] ED now for further work-up.  Patient agreed and verbalized understanding of these instructions and this plan of care this afternoon.  Patient is hemodynamically stable and okay to drive home now, from there her husband will take her to this ED. Patient discharged home, hemodynamically stable. Final Clinical Impressions(s) / UC Diagnoses   Final diagnoses:  Chest pain, unspecified type   Discharge Instructions   None    ED Prescriptions   None    PDMP not reviewed this encounter.   Trevor Iha, FNP 08/13/21 [phone]

## 2021-08-13 NOTE — ED Notes (Signed)
Patient is being discharged from the Urgent Care and sent to the Emergency Department via POV. Per Trevor Iha NP, patient is in need of higher level of care for further eval of Chest Pain. Patient is aware and verbalizes understanding of plan of care.  Vitals:   08/13/21 1427  BP: (!) 144/106  Pulse: 82  Resp: 17  Temp: 98.6 F (37 C)  SpO2: 97%

## 2021-08-13 NOTE — ED Triage Notes (Signed)
Pt c/o chest pain/pressure since yesterday evening. First started out as upper back and Lt shoulder pain. Then the chest pressure and pain under LT breast started. Family hx of cardiac problems. Says she feels pressure in chest when taking deep breaths.

## 2021-08-13 NOTE — Discharge Instructions (Addendum)
Advised patient given family history current symptoms, specifically mild chest pressure radiating to left posterior shoulder; and lack of serological testing for nearly 12 years, I have encouraged patient to go to [facility] ED now for further work-up.  Patient agreed and verbalized understanding of these instructions and this plan of care this afternoon.  Patient is hemodynamically stable and okay to drive home, from there her husband will take her to this ED.

## 2021-08-14 LAB — TROPONIN I (HIGH SENSITIVITY): Troponin I (High Sensitivity): <2 ng/L (ref <18)

## 2021-08-14 LAB — D-DIMER, QUANTITATIVE: D-Dimer, Quant: <0.27 ug/mL-FEU (ref 0.00–0.50)

## 2021-08-14 MED ORDER — KETOROLAC TROMETHAMINE 15 MG/ML IJ SOLN
15.0000 mg | Freq: Once | INTRAMUSCULAR | Status: AC
  Administered 2021-08-14: 15 mg via INTRAVENOUS
  Filled 2021-08-14: qty 1

## 2021-08-14 NOTE — ED Provider Notes (Signed)
[facility] EMERGENCY DEPT Provider Note   CSN: [id number] Arrival date & time: 08/13/21  1608     History  Chief Complaint  Patient presents with   Chest Pain    Robin Hopkins is a 41 y.o. female.  The history is provided by the patient and medical records.  Chest Pain Robin Hopkins is a 41 y.o. female who presents to the Emergency Department complaining of chest pain.  She presents to the ED accompanied by husband complaining of left posterior shoulder pain that started Wednesday night that involves the left anterior chest.  Pain is described as a pressure sensation, difficult to get comfortable, worse with deep breaths, movements.  No injuries.  Pain was worse last night compared to time of ED evaluation.  Pain comes and goes.  Father with hx/o CAD in 15s Mother with hx/o cardiac rhythm issues.    Does not have a PCP.    No fevers, cough, sob, abdominal pain, N/V, leg swelling/pain.    No hx/o DVT/PE.  No tobacco.  Occ alcohol.  No street drugs.      Home Medications Prior to Admission medications   Medication Sig Start Date End Date Taking? Authorizing Provider  albuterol (VENTOLIN HFA) 108 (90 Base) MCG/ACT inhaler Inhale 2 puffs into the lungs every 6 (six) hours as needed for wheezing or shortness of breath. 06/08/21   Couture, Cortni S, PA-C  fexofenadine (ALLEGRA ALLERGY) 180 MG tablet Take 1 tablet (180 mg total) by mouth daily for 15 days. 06/03/21 06/18/21  Trevor Iha, FNP  Multiple Vitamins-Minerals (AIRBORNE GUMMIES PO) Take by mouth.    [provider]  predniSONE (DELTASONE) 20 MG tablet Take 3 tabs PO x 5 days. 06/03/21   Trevor Iha, FNP  vitamin C (ASCORBIC ACID) 500 MG tablet Take 500 mg by mouth daily.    [provider]      Allergies    Patient has no known allergies.    Review of Systems   Review of Systems  Cardiovascular:  Positive for chest pain.  All other systems reviewed and are  negative.  Physical Exam Updated Vital Signs BP (!) 141/89 (BP Location: Right Wrist)    Pulse 73    Temp 98 F (36.7 C) (Oral)    Resp 19    Ht 5\' 3"  (1.6 m)    Wt 129.3 kg    LMP 07/18/2021 (Approximate)    SpO2 100%    BMI 50.49 kg/m  Physical Exam Vitals and nursing note reviewed.  Constitutional:      Appearance: She is well-developed.  HENT:     Head: Normocephalic and atraumatic.  Cardiovascular:     Rate and Rhythm: Normal rate and regular rhythm.     Heart sounds: No murmur heard. Pulmonary:     Effort: Pulmonary effort is normal. No respiratory distress.     Breath sounds: Normal breath sounds.  Chest:     Chest wall: No tenderness.  Abdominal:     Palpations: Abdomen is soft.     Tenderness: There is no abdominal tenderness. There is no guarding or rebound.  Musculoskeletal:        General: No swelling or tenderness.     Comments: 2+ radial and DP pulses.    Skin:    General: Skin is warm and dry.  Neurological:     Mental Status: She is alert and oriented to person, place, and time.  Psychiatric:        Behavior: Behavior  normal.    ED Results / Procedures / Treatments   Labs (all labs ordered are listed, but only abnormal results are displayed) Labs Reviewed  CBC - Abnormal; Notable for the following components:      Result Value   Hemoglobin 11.6 (*)    MCV 79.8 (*)    MCH 24.4 (*)    RDW 16.4 (*)    All other components within normal limits  COMPREHENSIVE METABOLIC PANEL - Abnormal; Notable for the following components:   AST 11 (*)    All other components within normal limits  D-DIMER, QUANTITATIVE  PREGNANCY, URINE  TROPONIN I (HIGH SENSITIVITY)  TROPONIN I (HIGH SENSITIVITY)    EKG EKG Interpretation  Date/Time:  Thursday August 13 2021 16:56:22 EST Ventricular Rate:  85 PR Interval:  136 QRS Duration: 86 QT Interval:  386 QTC Calculation: 459 R Axis:   -39 Text Interpretation: Normal sinus rhythm with sinus arrhythmia Left axis  deviation Cannot rule out Anterior infarct (cited on or before 13-Aug-2021) Abnormal ECG When compared with ECG of 13-Aug-2021 14:40, No significant change was found Confirmed by Tilden Fossa [phone]) on 08/13/2021 11:49:59 PM  Radiology DG Chest 2 View  Result Date: 08/13/2021 CLINICAL DATA:  Chest pain EXAM: CHEST - 2 VIEW COMPARISON:  05/22/2010 FINDINGS: The heart size and mediastinal contours are within normal limits. Both lungs are clear. The visualized skeletal structures are unremarkable. IMPRESSION: No active cardiopulmonary disease. Electronically Signed   By: Marlan Palau M.D.   On: 08/13/2021 17:27    Procedures Procedures    Medications Ordered in ED Medications  ketorolac (TORADOL) 15 MG/ML injection 15 mg (15 mg Intravenous Given 08/14/21 0129)    ED Course/ Medical Decision Making/ A&P                           Medical Decision Making  Patient here for evaluation of left-sided chest pain, back pain.  Pain is reproducible with movement and but not consistently reproducible.  EKG is without acute abnormality.  Troponins are negative x2.  Her D-dimer is negative, doubt PE, dissection.  No evidence of pneumonia.  CBC with mild anemia.  Current clinical picture is not consistent with ACS.  Discussed with patient likely chest wall pain.  Recommend NSAIDs, activity as tolerated.  Discussed outpatient follow-up and return precautions.  Patient is noted to be mildly hypertensive on ED evaluation with blood pressures 140s over 100s.  On record review she has experienced similar elevation in blood pressures for the last 2 years on office visits.  Suspect this is her baseline.  No evidence of hypertensive emergency at this time.  Discussed PCP follow-up for recheck of her blood pressure.        Final Clinical Impression(s) / ED Diagnoses Final diagnoses:  Chest wall pain  Elevated blood-pressure reading without diagnosis of hypertension    Rx / DC Orders ED Discharge Orders      None         Tilden Fossa, MD 08/14/21 0214

## 2022-05-24 ENCOUNTER — Other Ambulatory Visit (HOSPITAL_BASED_OUTPATIENT_CLINIC_OR_DEPARTMENT_OTHER): Payer: Self-pay

## 2022-05-24 MED ORDER — INFLUENZA VAC SPLIT QUAD 0.5 ML IM SUSY
PREFILLED_SYRINGE | INTRAMUSCULAR | 0 refills | Status: DC
  Filled 2022-05-24: qty 0.5, 1d supply, fill #0

## 2022-06-05 ENCOUNTER — Encounter: Payer: Self-pay | Admitting: Emergency Medicine

## 2022-06-05 ENCOUNTER — Ambulatory Visit
Admission: EM | Admit: 2022-06-05 | Discharge: 2022-06-05 | Disposition: A | Discharge status: Home / Self Care | Payer: Managed Care, Other (non HMO) | Attending: Family Medicine | Admitting: Family Medicine

## 2022-06-05 DIAGNOSIS — R030 Elevated blood-pressure reading, without diagnosis of hypertension: Secondary | ICD-10-CM | POA: Diagnosis not present

## 2022-06-05 DIAGNOSIS — J020 Streptococcal pharyngitis: Secondary | ICD-10-CM | POA: Diagnosis not present

## 2022-06-05 LAB — POCT RAPID STREP A (OFFICE): Rapid Strep A Screen: POSITIVE — AB

## 2022-06-05 MED ORDER — PENICILLIN V POTASSIUM 500 MG PO TABS: 500.0000 mg | ORAL_TABLET | Freq: Two times a day (BID) | ORAL | 0 refills | Status: AC

## 2022-06-05 NOTE — Discharge Instructions (Addendum)
Make sure you take 10 full days of antibiotics.  Take 2 doses today Drink lots of fluids May take Tylenol or ibuprofen as needed for pain Chloraseptic spray or lozenges, salt water gargles help with pain Call for problems

## 2022-06-05 NOTE — ED Provider Notes (Addendum)
[facility]in [facility]    CSN: [id number] Arrival date & time: 06/05/22  0950      History   Chief Complaint Chief Complaint  Patient presents with   Sore Throat    HPI Robin Hopkins is a 41 y.o. female.   HPI  Patient has severe sore throat.  Its been going on for about a week.  No known exposure to strep or COVID.  No cough or runny nose.  No headache or body aches Past Medical History:  Diagnosis Date   Hypertension     There are no problems to display for this patient.   Past Surgical History:  Procedure Laterality Date   CESAREAN SECTION     CHOLECYSTECTOMY      OB History   No obstetric history on file.      Home Medications    Prior to Admission medications   Medication Sig Start Date End Date Taking? Authorizing Provider  Multiple Vitamins-Minerals (AIRBORNE GUMMIES PO) Take by mouth.   Yes [provider]  penicillin v potassium (VEETID) 500 MG tablet Take 1 tablet (500 mg total) by mouth 2 (two) times daily for 10 days. 06/05/22 06/15/22 Yes Eustace Moore, MD  albuterol (VENTOLIN HFA) 108 (90 Base) MCG/ACT inhaler Inhale 2 puffs into the lungs every 6 (six) hours as needed for wheezing or shortness of breath. 06/08/21   Couture, Cortni S, PA-C    Family History Family History  Problem Relation Age of Onset   Hypertension Mother    COPD Mother    Diabetes Mother    Heart attack Father    Diabetes Father     Social History Social History   Tobacco Use   Smoking status: Former   Smokeless tobacco: Never  Building services engineer Use: Never used  Substance Use Topics   Alcohol use: Yes   Drug use: No     Allergies   Patient has no known allergies.   Review of Systems Review of Systems See HPI  Physical Exam Triage Vital Signs ED Triage Vitals  Enc Vitals Group     BP 06/05/22 1000 (!) 141/105     Pulse Rate 06/05/22 1000 (!) 103     Resp 06/05/22 1000 18     Temp 06/05/22 1000 99 F (37.2 C)     Temp Source  06/05/22 1000 Oral     SpO2 06/05/22 1000 96 %     Weight 06/05/22 1002 275 lb (124.7 kg)     Height 06/05/22 1002 5\' 3"  (1.6 m)     Head Circumference --      Peak Flow --      Pain Score 06/05/22 1002 6     Pain Loc --      Pain Edu? --      Excl. in GC? --    No data found.  Updated Vital Signs BP (!) 141/105 (BP Location: Right Arm)   Pulse (!) 103   Temp 99 F (37.2 C) (Oral)   Resp 18   Ht 5\' 3"  (1.6 m)   Wt 124.7 kg   LMP 05/15/2022 (Exact Date)   SpO2 96%   BMI 48.71 kg/m      Physical Exam Constitutional:      General: She is not in acute distress.    Appearance: She is well-developed. She is obese.  HENT:     Head: Normocephalic and atraumatic.     Right Ear: Tympanic membrane and ear canal  normal.     Left Ear: Tympanic membrane and ear canal normal.     Nose: No rhinorrhea.     Mouth/Throat:     Mouth: Mucous membranes are moist.     Pharynx: Uvula midline. Pharyngeal swelling and posterior oropharyngeal erythema present.     Tonsils: No tonsillar exudate. 1+ on the right. 1+ on the left.  Eyes:     Conjunctiva/sclera: Conjunctivae normal.     Pupils: Pupils are equal, round, and reactive to light.  Cardiovascular:     Rate and Rhythm: Normal rate.  Pulmonary:     Effort: Pulmonary effort is normal. No respiratory distress.  Abdominal:     General: There is no distension.     Palpations: Abdomen is soft.  Musculoskeletal:        General: Normal range of motion.     Cervical back: Normal range of motion.  Lymphadenopathy:     Cervical: Cervical adenopathy present.  Skin:    General: Skin is warm and dry.  Neurological:     Mental Status: She is alert.      UC Treatments / Results  Labs (all labs ordered are listed, but only abnormal results are displayed) Labs Reviewed  POCT RAPID STREP A (OFFICE) - Abnormal; Notable for the following components:      Result Value   Rapid Strep A Screen Positive (*)    All other components within normal  limits    EKG   Radiology No results found.  Procedures Procedures (including critical care time)  Medications Ordered in UC Medications - No data to display  Initial Impression / Assessment and Plan / UC Course  I have reviewed the triage vital signs and the nursing notes.  Pertinent labs & imaging results that were available during my care of the patient were reviewed by me and considered in my medical decision making (see chart for details).    Recommended patient see PCP for elevated blood pressure Final Clinical Impressions(s) / UC Diagnoses   Final diagnoses:  Strep throat     Discharge Instructions      Make sure you take 10 full days of antibiotics.  Take 2 doses today Drink lots of fluids May take Tylenol or ibuprofen as needed for pain Chloraseptic spray or lozenges, salt water gargles help with pain Call for problems   ED Prescriptions     Medication Sig Dispense Auth. Provider   penicillin v potassium (VEETID) 500 MG tablet Take 1 tablet (500 mg total) by mouth 2 (two) times daily for 10 days. 20 tablet Raylene Everts, MD      PDMP not reviewed this encounter.   Raylene Everts, MD 06/05/22 1101    Raylene Everts, MD 06/05/22 1102

## 2022-06-05 NOTE — ED Triage Notes (Signed)
Patient c/o severe sore throat x 1 week, throat feels like she's swallowed razor blades.  Other cold sx's have resolved not the sore throat.  Been doing salt water goggles, OTC cold meds.

## 2022-07-20 ENCOUNTER — Encounter: Payer: Self-pay | Admitting: Family Medicine

## 2022-07-21 ENCOUNTER — Ambulatory Visit (INDEPENDENT_AMBULATORY_CARE_PROVIDER_SITE_OTHER): Payer: Managed Care, Other (non HMO) | Admitting: Family Medicine

## 2022-07-21 ENCOUNTER — Encounter: Payer: Self-pay | Admitting: Family Medicine

## 2022-07-21 VITALS — BP 127/89 | HR 73 | Temp 98.5°F | Ht 63.75 in | Wt 279.0 lb

## 2022-07-21 DIAGNOSIS — G5711 Meralgia paresthetica, right lower limb: Secondary | ICD-10-CM | POA: Diagnosis not present

## 2022-07-21 DIAGNOSIS — Z1231 Encounter for screening mammogram for malignant neoplasm of breast: Secondary | ICD-10-CM | POA: Diagnosis not present

## 2022-07-21 DIAGNOSIS — R0683 Snoring: Secondary | ICD-10-CM | POA: Diagnosis not present

## 2022-07-21 DIAGNOSIS — Z6841 Body Mass Index (BMI) 40.0 and over, adult: Secondary | ICD-10-CM

## 2022-07-21 DIAGNOSIS — R0681 Apnea, not elsewhere classified: Secondary | ICD-10-CM

## 2022-07-21 DIAGNOSIS — M79604 Pain in right leg: Secondary | ICD-10-CM

## 2022-07-21 DIAGNOSIS — Z7689 Persons encountering health services in other specified circumstances: Secondary | ICD-10-CM

## 2022-07-21 NOTE — Progress Notes (Signed)
Patient ID: Robin Hopkins, female  DOB: [birth year], 41 y.o.   MRN: [id number] Patient Care Team    Relationship Specialty Notifications Start End  Ma Hillock, DO PCP - General Family Medicine  07/21/22     Chief Complaint  Patient presents with   Establish Care   Snoring    Pt c/o ongoing snoring worsening in the last year    Leg Pain    Pt c/o R leg numbness x 1 year;     Subjective:  Robin Hopkins is a 41 y.o.  female present for new patient establishment. All past medical history, surgical history, allergies, family history, immunizations, medications and social history were updated in the electronic medical record today. All recent labs, ED visits and hospitalizations within the last year were reviewed.  Snoring: Patient reports she has ongoing snoring that has worsened over the last year.  Her husband noticed apneic spells where she was trying to catch her breath. Body mass index is 48.27 kg/m.  Reports she wakes up fatigued.  Leg pain: Patient reports right-sided leg discomfort especially when walking or standing for longer periods of time.  Discomfort/numbness is along the right anterior and lateral thigh.  Does not go below the knee.  Patient has not had any hip or back trauma.  Patient has not underwent any hip or back surgeries.     07/21/2022   10:31 AM  Depression screen PHQ 2/9  Decreased Interest 0  Down, Depressed, Hopeless 0  PHQ - 2 Score 0       No data to display                 No data to display         Immunization History  Administered Date(s) Administered   Hep A / Hep B 02/12/2014, 03/25/2014, 08/28/2014   IPV 02/12/2014   Influenza Split 07/24/2010   Influenza, Seasonal, Injecte, Preservative Fre 07/15/2015   Influenza-Unspecified 05/24/2022   PFIZER(Purple Top)SARS-COV-2 Vaccination 11/16/2019, 12/07/2019, 07/24/2020   Tdap 07/24/2010    No results found.  Past Medical History:  Diagnosis Date   Allergy     Seasonal with pollen   Frequent UTI    Heartburn    Hypertension affecting pregnancy    Seasonal allergies    No Known Allergies Past Surgical History:  Procedure Laterality Date   CESAREAN SECTION  2009   CHOLECYSTECTOMY  2011   Family History  Problem Relation Age of Onset   Hypertension Mother    Diabetes Mother    Heart disease Mother    Miscarriages / Stillbirths Mother    Obesity Mother    Endometrial cancer Mother    Sleep apnea Mother    Sleep apnea Father    Hypertension Father    Heart attack Father    Diabetes Father    Hearing loss Father    Dementia Father    Heart disease Father    Obesity Father    Vision loss Father    Hypertension Brother    Alcohol abuse Brother    Heart disease Maternal Grandmother    Arthritis Maternal Grandmother    Heart attack Maternal Grandfather    Colon cancer Maternal Grandfather    Cancer Paternal Grandmother    Hearing loss Paternal Grandmother    Dementia Paternal Grandmother    Colon cancer Paternal Grandfather    Social History   Social History Narrative   Marital status/children/pets: Married, 1 son   Education/employment:  [employer]- Audiological scientist- unemployed    Safety:      -smoke alarm in the home:Yes     - wears seatbelt: Yes     - Feels safe in their relationships: Yes       Allergies as of 07/21/2022   No Known Allergies      Medication List        Accurate as of July 21, 2022 11:59 PM. If you have any questions, ask your nurse or doctor.          STOP taking these medications    albuterol 108 (90 Base) MCG/ACT inhaler Commonly known as: VENTOLIN HFA Stopped by: Felix Pacini, DO       TAKE these medications    AIRBORNE GUMMIES PO Take by mouth.   cholecalciferol 25 MCG (1000 UNIT) tablet Commonly known as: VITAMIN D3 Take 1,000 Units by mouth daily.   fluticasone 50 MCG/ACT nasal spray Commonly known as: FLONASE Place into both nostrils daily.   vitamin C with rose hips 500  MG tablet Take 500 mg by mouth daily.        All past medical history, surgical history, allergies, family history, immunizations andmedications were updated in the EMR today and reviewed under the history and medication portions of their EMR.     No results found.   ROS 14 pt review of systems performed and negative (unless mentioned in an HPI)  Objective: BP 127/89   Pulse 73   Temp 98.5 F (36.9 C) (Oral)   Ht 5' 3.75" (1.619 m)   Wt 279 lb (126.6 kg)   LMP 07/11/2022   SpO2 99%   BMI 48.27 kg/m  Physical Exam Vitals and nursing note reviewed.  Constitutional:      General: She is not in acute distress.    Appearance: Normal appearance. She is normal weight. She is not ill-appearing or toxic-appearing.  Eyes:     Extraocular Movements: Extraocular movements intact.     Conjunctiva/sclera: Conjunctivae normal.     Pupils: Pupils are equal, round, and reactive to light.  Cardiovascular:     Rate and Rhythm: Normal rate and regular rhythm.  Pulmonary:     Effort: Pulmonary effort is normal. No respiratory distress.     Breath sounds: Normal breath sounds. No wheezing, rhonchi or rales.  Musculoskeletal:        General: No signs of injury. Normal range of motion.     Comments: No lumbar bony tenderness.  Muscle strength 5 out of 5 bilateral lower extremities.  Neurovascular intact distally  Neurological:     Mental Status: She is alert and oriented to person, place, and time. Mental status is at baseline.     Motor: No weakness.     Coordination: Coordination normal.     Gait: Gait normal.  Psychiatric:        Mood and Affect: Mood normal.        Behavior: Behavior normal.        Thought Content: Thought content normal.        Judgment: Judgment normal.     Assessment/plan: Reet Scharrer is a 41 y.o. female present for est care Establishing care with new doctor, encounter for Snoring/Class 3 severe obesity due to excess calories without serious comorbidity  with body mass index (BMI) of 45.0 to 49.9 in adult (HCC)/Witnessed apneic spells Patient is at high risk for sleep apnea.  We discussed referral to pulmonology for a sleep study and further eval and she  is agreeable to this today. 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  She would likely benefit from OMT and sports med referral today.  And she is agreeable to this. - Ambulatory referral to Sports Medicine  Breast cancer screening by mammogram - MM 3D SCREEN BREAST BILATERAL; Future   Return in about 7 weeks (around 09/08/2022) for cpe (20 min).  Orders Placed This Encounter  Procedures   MM 3D SCREEN BREAST BILATERAL   Ambulatory referral to Pulmonology   Ambulatory referral to Sports Medicine   No orders of the defined types were placed in this encounter.  Referral Orders         Ambulatory referral to Pulmonology         Ambulatory referral to Sports Medicine       Note is dictated utilizing voice recognition software. Although note has been proof read prior to signing, occasional typographical errors still can be missed. If any questions arise, please do not hesitate to call for verification.  Electronically signed by: Howard Pouch, DO [place]

## 2022-07-21 NOTE — Patient Instructions (Addendum)
No follow-ups on file.        Great to see you today.  I have refilled the medication(s) we provide.   If labs were collected, we will inform you of lab results once received either by echart message or telephone call.   - echart message- for normal results that have been seen by the patient already.   - telephone call: abnormal results or if patient has not viewed results in their echart.   Lateral cutaneous femoral nerve syndrome

## 2022-07-28 NOTE — Progress Notes (Signed)
   I, [name], LAT, ATC acting as a scribe for Robin Graham, MD.  Subjective:    CC: R leg pain  HPI: Pt is a 41 y/o female c/o R leg pain x  1 year+. Pt noticed pain after prolonged standing at her son's ice hockey games. Pt locates pain to the lateral aspect of her R thigh. Numbness/tinging varies.   Low back pain: no Radiating pain: no LE numbness/tingling: yes LE weakness: no Aggravates: prolonged standing or walking Treatments tried: rub the area, sitting down.   Pertinent review of Systems: No fevers or chills  Relevant historical information: Obesity   Objective:    Vitals:   08/03/22 0738  BP: (!) 158/90  Pulse: 83  SpO2: 98%   General: Well Developed, well nourished, and in no acute distress.   MSK: Right hip: Normal-appearing Normal motion. Intact strength. Nontender. Normal gait.    Impression and Recommendations:    Assessment and Plan: 41 y.o. female with right lateral thigh pain thought to be meralgia paresthetica.  Her main risk factor is obesity.  We discussed options.  Plan to emphasize weight loss.  I prescribed gabapentin to take as needed.  Provided information for the 2 main weight loss clinics in town [facility].  The most obvious next treatment and diagnosis effort would be a targeted diagnostic and therapeutic ultrasound guided injection of the lateral femoral cutaneous nerve. Plan to recheck in a few months.  If not improved that would likely be the next step.  PDMP not reviewed this encounter. No orders of the defined types were placed in this encounter.  Meds ordered this encounter  Medications   gabapentin (NEURONTIN) 300 MG capsule    Sig: Take 1 capsule (300 mg total) by mouth 3 (three) times daily as needed.    Dispense:  90 capsule    Refill:  3    Discussed warning signs or symptoms. Please see discharge instructions. Patient expresses understanding.   The above documentation has been reviewed and is accurate  and complete Robin Hopkins, M.D.

## 2022-08-03 ENCOUNTER — Ambulatory Visit (INDEPENDENT_AMBULATORY_CARE_PROVIDER_SITE_OTHER): Payer: Managed Care, Other (non HMO) | Admitting: Family Medicine

## 2022-08-03 ENCOUNTER — Ambulatory Visit: Payer: Self-pay

## 2022-08-03 VITALS — BP 158/90 | HR 83 | Ht 63.0 in | Wt 283.0 lb

## 2022-08-03 DIAGNOSIS — M79604 Pain in right leg: Secondary | ICD-10-CM | POA: Diagnosis not present

## 2022-08-03 MED ORDER — GABAPENTIN 300 MG PO CAPS: 300.0000 mg | ORAL_CAPSULE | Freq: Three times a day (TID) | ORAL | 3 refills | Status: DC | PRN

## 2022-08-03 NOTE — Patient Instructions (Addendum)
Thank you for coming in today.   [facility] [address] [place], [place] [address] Phone: [phone]   [facility] [address] [place], [place] [address] Phone: [phone]    Recheck in 2 months.  I can do the injection anytime.

## 2022-08-20 ENCOUNTER — Institutional Professional Consult (permissible substitution) (HOSPITAL_BASED_OUTPATIENT_CLINIC_OR_DEPARTMENT_OTHER): Payer: Managed Care, Other (non HMO) | Admitting: Pulmonary Disease

## 2022-09-07 ENCOUNTER — Encounter (HOSPITAL_BASED_OUTPATIENT_CLINIC_OR_DEPARTMENT_OTHER): Payer: Self-pay | Admitting: Pulmonary Disease

## 2022-09-07 ENCOUNTER — Ambulatory Visit (INDEPENDENT_AMBULATORY_CARE_PROVIDER_SITE_OTHER): Payer: 59 | Admitting: Pulmonary Disease

## 2022-09-07 VITALS — BP 138/82 | HR 87 | Temp 98.8°F | Ht 63.0 in | Wt 282.0 lb

## 2022-09-07 DIAGNOSIS — R0683 Snoring: Secondary | ICD-10-CM | POA: Diagnosis not present

## 2022-09-07 DIAGNOSIS — G4733 Obstructive sleep apnea (adult) (pediatric): Secondary | ICD-10-CM

## 2022-09-07 HISTORY — DX: Obstructive sleep apnea (adult) (pediatric): G47.33

## 2022-09-07 NOTE — Progress Notes (Signed)
Subjective:    Patient ID: Robin Hopkins, female    DOB: 01-Jun-1981, 42 y.o.   MRN: [id number]  HPI   Chief Complaint  Patient presents with   Consult    Pt states that she mentioned to her PCP that she knows she has sleep apnea because she snores and her husband has witnessed her stop breathing for the past 20 years.    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 Family members have noticed snoring for many years and husband has also noted that she holds her breath cannot sleep.  They sleep in a different room now due to different sleep condition requirements. Epworth Sleepiness Scale is 7 and she reports some sleepiness as a passenger in a car or lying down to rest in the afternoon.  We can naps on the couch for about 2 hours and these are refreshing.  She is able to get through her workday. Bedtime is between 10 and 11 PM, she is used to the sound of the TV to help her sleep, sleep latency can be about 20 minutes, she starts of sleeping on her back but rolls over on her side, uses 2 pillows, reports 0-2 nocturnal awakenings including nocturia and is out of bed at 6:45 AM with dryness of mouth feeling rested without headaches.  Chest gain 15 pounds over the last 2 years. She denies excessive use of caffeinated drinks but does drink occasional soda   Past Medical History:  Diagnosis Date   Allergy    Seasonal with pollen   Frequent UTI    Heartburn    Hypertension affecting pregnancy    Seasonal allergies    Past Surgical History:  Procedure Laterality Date   CESAREAN SECTION  2009   CHOLECYSTECTOMY  2011    No Known Allergies  Social History   Socioeconomic History   Marital status: Married    Spouse name: Not on file   Number of children: Not on file   Years of education: Not on file   Highest education level: Not on file  Occupational History   Not on file  Tobacco Use   Smoking status: Former    Passive exposure:  Never   Smokeless tobacco: Never  Vaping Use   Vaping Use: Never used  Substance and Sexual Activity   Alcohol use: Yes    Comment: Hardly any - 1 drink per month   Drug use: No   Sexual activity: Yes    Partners: Male    Birth control/protection: Surgical    Comment: Husband vasectomy  Other Topics Concern   Not on file  Social History Narrative   Marital status/children/pets: Married, 1 son   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    Safety:      -smoke alarm in the home:Yes     - wears seatbelt: Yes     - Feels safe in their relationships: Yes      Social Determinants of Radio broadcast assistant Strain: Not on file  Food Insecurity: Not on file  Transportation Needs: Not on file  Physical Activity: Not on file  Stress: Not on file  Social Connections: Not on file  Intimate Partner Violence: Not on file    Family History  Problem Relation Age of Onset   Hypertension Mother    Diabetes Mother    Heart disease Mother    Miscarriages / Stillbirths Mother    Obesity Mother    Endometrial cancer Mother  Sleep apnea Mother    Sleep apnea Father    Hypertension Father    Heart attack Father    Diabetes Father    Hearing loss Father    Dementia Father    Heart disease Father    Obesity Father    Vision loss Father    Hypertension Brother    Alcohol abuse Brother    Heart disease Maternal Grandmother    Arthritis Maternal Grandmother    Heart attack Maternal Grandfather    Colon cancer Maternal Grandfather    Cancer Paternal Grandmother    Hearing loss Paternal Grandmother    Dementia Paternal Grandmother    Colon cancer Paternal Grandfather       Review of Systems Shortness of breath on activity example climbing multiple flights of stairs Nonproductive cough still lingering from Christmas cold Tooth problems Nasal congestion related to weather Joint stiffness in her knees    Objective:   Physical Exam  Gen. Pleasant, obese, in  no distress, normal affect ENT - no pallor,icterus, no post nasal drip, class 2 airway, tonsillar tissue +, 1 mm overbite Neck: No JVD, no thyromegaly, no carotid bruits Lungs: no use of accessory muscles, no dullness to percussion, decreased without rales or rhonchi  Cardiovascular: Rhythm regular, heart sounds  normal, no murmurs or gallops, no peripheral edema Abdomen: soft and non-tender, no hepatosplenomegaly, BS normal. Musculoskeletal: No deformities, no cyanosis or clubbing Neuro:  alert, non focal, no tremors       Assessment & Plan:

## 2022-09-07 NOTE — Assessment & Plan Note (Signed)
Given excessive daytime somnolence, narrow pharyngeal exam, witnessed apneas & loud snoring, obstructive sleep apnea is very likely & an overnight polysomnogram will be scheduled as a home study. The pathophysiology of obstructive sleep apnea , it's cardiovascular consequences & modes of treatment including CPAP were discused with the patient in detail & they evidenced understanding.  Pretest probability is high.  She would be willing to use CPAP if needed

## 2022-09-07 NOTE — Patient Instructions (Signed)
Home sleep test 

## 2022-09-09 ENCOUNTER — Ambulatory Visit (INDEPENDENT_AMBULATORY_CARE_PROVIDER_SITE_OTHER): Payer: 59 | Admitting: Family Medicine

## 2022-09-09 ENCOUNTER — Encounter: Payer: Self-pay | Admitting: Family Medicine

## 2022-09-09 VITALS — BP 117/87 | HR 73 | Temp 98.7°F | Ht 63.78 in | Wt 280.0 lb

## 2022-09-09 DIAGNOSIS — Z0000 Encounter for general adult medical examination without abnormal findings: Secondary | ICD-10-CM

## 2022-09-09 DIAGNOSIS — Z131 Encounter for screening for diabetes mellitus: Secondary | ICD-10-CM | POA: Diagnosis not present

## 2022-09-09 DIAGNOSIS — Z130 Encounter for screening for diseases of the blood and blood-forming organs and certain disorders involving the immune mechanism: Secondary | ICD-10-CM | POA: Diagnosis not present

## 2022-09-09 DIAGNOSIS — Z23 Encounter for immunization: Secondary | ICD-10-CM

## 2022-09-09 DIAGNOSIS — Z1159 Encounter for screening for other viral diseases: Secondary | ICD-10-CM

## 2022-09-09 DIAGNOSIS — Z13220 Encounter for screening for lipoid disorders: Secondary | ICD-10-CM

## 2022-09-09 LAB — COMPREHENSIVE METABOLIC PANEL
ALT: 13 U/L (ref 0–35)
AST: 11 U/L (ref 0–37)
Albumin: 4.1 g/dL (ref 3.5–5.2)
Alkaline Phosphatase: 87 U/L (ref 39–117)
BUN: 8 mg/dL (ref 6–23)
CO2: 26 mEq/L (ref 19–32)
Calcium: 8.8 mg/dL (ref 8.4–10.5)
Chloride: 104 mEq/L (ref 96–112)
Creatinine, Ser: 0.68 mg/dL (ref 0.40–1.20)
GFR: 106.55 mL/min (ref >60.00)
Glucose, Bld: 90 mg/dL (ref 70–99)
Potassium: 4.1 mEq/L (ref 3.5–5.1)
Sodium: 139 mEq/L (ref 135–145)
Total Bilirubin: 0.6 mg/dL (ref 0.2–1.2)
Total Protein: 7.1 g/dL (ref 6.0–8.3)

## 2022-09-09 LAB — CBC WITH DIFFERENTIAL/PLATELET
Basophils Absolute: 0 10*3/uL (ref 0.0–0.1)
Basophils Relative: 0.5 % (ref 0.0–3.0)
Eosinophils Absolute: 0.1 10*3/uL (ref 0.0–0.7)
Eosinophils Relative: 1.1 % (ref 0.0–5.0)
HCT: 37.5 % (ref 36.0–46.0)
Hemoglobin: 12 g/dL (ref 12.0–15.0)
Lymphocytes Relative: 24.5 % (ref 12.0–46.0)
Lymphs Abs: 2 10*3/uL (ref 0.7–4.0)
MCHC: 32.1 g/dL (ref 30.0–36.0)
MCV: 80 fl (ref 78.0–100.0)
Monocytes Absolute: 0.5 10*3/uL (ref 0.1–1.0)
Monocytes Relative: 6.8 % (ref 3.0–12.0)
Neutro Abs: 5.4 10*3/uL (ref 1.4–7.7)
Neutrophils Relative %: 67.1 % (ref 43.0–77.0)
Platelets: 283 10*3/uL (ref 150.0–400.0)
RBC: 4.68 Mil/uL (ref 3.87–5.11)
RDW: 15.9 % — ABNORMAL HIGH (ref 11.5–15.5)
WBC: 8 10*3/uL (ref 4.0–10.5)

## 2022-09-09 LAB — LIPID PANEL
Cholesterol: 141 mg/dL (ref 0–200)
HDL: 39.6 mg/dL (ref >39.00)
LDL Cholesterol: 74 mg/dL (ref 0–99)
NonHDL: 100.99
Total CHOL/HDL Ratio: 4
Triglycerides: 134 mg/dL (ref 0.0–149.0)
VLDL: 26.8 mg/dL (ref 0.0–40.0)

## 2022-09-09 LAB — HEMOGLOBIN A1C: Hgb A1c MFr Bld: 5.5 % (ref 4.6–6.5)

## 2022-09-09 NOTE — Progress Notes (Signed)
Patient ID: Robin Hopkins, female  DOB: 02/18/1981, 42 y.o.   MRN: [id number] Patient Care Team    Relationship Specialty Notifications Start End  Ma Hillock, DO PCP - General Family Medicine  07/21/22   Megan Salon, MD Consulting Physician Obstetrics and Gynecology  09/09/22     Chief Complaint  Patient presents with   Annual Exam    Pt is fasting     Subjective:  Robin Hopkins is a 42 y.o.  Female  present for CPE. All past medical history, surgical history, allergies, family history, immunizations, medications and social history were updated in the electronic medical record today. All recent labs, ED visits and hospitalizations within the last year were reviewed.  Health maintenance:  Colonoscopy: Colon cancer present in MGF and PGF at later ages.  Routine screening at 39 recommended  Mammogram: No family history.  Scheduled for 09/15/2022 Cervical cancer screening: scheduled with Dr. Sabra Heck (GYN) Immunizations: tdap updated today 09/09/2022, Influenza UTD 2023 (encouraged yearly) Infectious disease screening: HIV completed pregnancy, Hep C collected today DEXA: Routine screening Patient has a Dental home. Hospitalizations/ED visits: Reviewed     07/21/2022   10:31 AM  Depression screen PHQ 2/9  Decreased Interest 0  Down, Depressed, Hopeless 0  PHQ - 2 Score 0       No data to display          Immunization History  Administered Date(s) Administered   Hep A / Hep B 02/12/2014, 03/25/2014, 08/28/2014   IPV 02/12/2014   Influenza Split 07/24/2010   Influenza, Seasonal, Injecte, Preservative Fre 07/15/2015   Influenza-Unspecified 05/24/2022   PFIZER(Purple Top)SARS-COV-2 Vaccination 11/16/2019, 12/07/2019, 07/24/2020   Tdap 07/24/2010, 09/09/2022    Past Medical History:  Diagnosis Date   Allergy    Seasonal with pollen   Frequent UTI    Heartburn    Hypertension affecting pregnancy    OSA (obstructive sleep apnea) 09/07/2022   Seasonal allergies     No Known Allergies Past Surgical History:  Procedure Laterality Date   CESAREAN SECTION  2009   CHOLECYSTECTOMY  2011   Family History  Problem Relation Age of Onset   Hypertension Mother    Diabetes Mother    Heart disease Mother    Miscarriages / Stillbirths Mother    Obesity Mother    Endometrial cancer Mother    Sleep apnea Mother    Sleep apnea Father    Hypertension Father    Heart attack Father    Diabetes Father    Hearing loss Father    Dementia Father    Heart disease Father    Obesity Father    Vision loss Father    Hypertension Brother    Alcohol abuse Brother    Heart disease Maternal Grandmother    Arthritis Maternal Grandmother    Heart attack Maternal Grandfather    Colon cancer Maternal Grandfather    Cancer Paternal Grandmother    Hearing loss Paternal Grandmother    Dementia Paternal Grandmother    Colon cancer Paternal Grandfather    Social History   Social History Narrative   Marital status/children/pets: Married, 1 son   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    Safety:      -smoke alarm in the home:Yes     - wears seatbelt: Yes     - Feels safe in their relationships: Yes       Allergies as of 09/09/2022   No Known Allergies  Medication List        Accurate as of September 09, 2022  9:14 AM. If you have any questions, ask your nurse or doctor.          AIRBORNE GUMMIES PO Take by mouth.   cholecalciferol 25 MCG (1000 UNIT) tablet Commonly known as: VITAMIN D3 Take 1,000 Units by mouth daily.   cyanocobalamin 1000 MCG tablet Commonly known as: VITAMIN B12 Take 1,000 mcg by mouth 3 (three) times a week.   fluticasone 50 MCG/ACT nasal spray Commonly known as: FLONASE Place into both nostrils daily.   gabapentin 300 MG capsule Commonly known as: NEURONTIN Take 1 capsule (300 mg total) by mouth 3 (three) times daily as needed.   vitamin C with rose hips 500 MG tablet Take 500 mg by mouth daily.         All past medical history, surgical history, allergies, family history, immunizations andmedications were updated in the EMR today and reviewed under the history and medication portions of their EMR.     No results found for this or any previous visit (from the past 2160 hour(s)).  No results found.   ROS 14 pt review of systems performed and negative (unless mentioned in an HPI)  Objective: BP 117/87   Pulse 73   Temp 98.7 F (37.1 C) (Oral)   Ht 5' 3.78" (1.62 m)   Wt 280 lb (127 kg)   LMP 08/12/2022   SpO2 98%   BMI 48.39 kg/m  Physical Exam Vitals and nursing note reviewed.  Constitutional:      General: She is not in acute distress.    Appearance: Normal appearance. She is not ill-appearing or toxic-appearing.  HENT:     Head: Normocephalic and atraumatic.     Right Ear: Tympanic membrane, ear canal and external ear normal. There is no impacted cerumen.     Left Ear: Tympanic membrane, ear canal and external ear normal. There is no impacted cerumen.     Nose: No congestion or rhinorrhea.     Mouth/Throat:     Mouth: Mucous membranes are moist.     Pharynx: Oropharynx is clear. No oropharyngeal exudate or posterior oropharyngeal erythema.  Eyes:     General: No scleral icterus.       Right eye: No discharge.        Left eye: No discharge.     Extraocular Movements: Extraocular movements intact.     Conjunctiva/sclera: Conjunctivae normal.     Pupils: Pupils are equal, round, and reactive to light.  Cardiovascular:     Rate and Rhythm: Normal rate and regular rhythm.     Pulses: Normal pulses.     Heart sounds: Normal heart sounds. No murmur heard.    No friction rub. No gallop.  Pulmonary:     Effort: Pulmonary effort is normal. No respiratory distress.     Breath sounds: Normal breath sounds. No stridor. No wheezing, rhonchi or rales.  Chest:     Chest wall: No tenderness.  Abdominal:     General: Abdomen is flat. Bowel sounds are normal. There is no  distension.     Palpations: Abdomen is soft. There is no mass.     Tenderness: There is no abdominal tenderness. There is no right CVA tenderness, left CVA tenderness, guarding or rebound.     Hernia: No hernia is present.  Musculoskeletal:        General: No swelling, tenderness or deformity. Normal range of motion.  Cervical back: Normal range of motion and neck supple. No rigidity or tenderness.     Right lower leg: No edema.     Left lower leg: No edema.  Lymphadenopathy:     Cervical: No cervical adenopathy.  Skin:    General: Skin is warm and dry.     Coloration: Skin is not jaundiced or pale.     Findings: No bruising, erythema, lesion or rash.  Neurological:     General: No focal deficit present.     Mental Status: She is alert and oriented to person, place, and time. Mental status is at baseline.     Cranial Nerves: No cranial nerve deficit.     Sensory: No sensory deficit.     Motor: No weakness.     Coordination: Coordination normal.     Gait: Gait normal.     Deep Tendon Reflexes: Reflexes normal.  Psychiatric:        Mood and Affect: Mood normal.        Behavior: Behavior normal.        Thought Content: Thought content normal.        Judgment: Judgment normal.      No results found.  Assessment/plan: Lori-Ann Lindfors is a 42 y.o. female present for CPE  Need for Tdap vaccination Administered today Encounter for hepatitis C screening test for low risk patient - Hepatitis C Antibody Lipid screening - Lipid panel Diabetes mellitus screening - Comprehensive metabolic panel - Hemoglobin A1c Screening for deficiency anemia - CBC with Differential/Platelet Routine general medical examination at a health care facility Colonoscopy: Colon cancer present in [facility] and PGF at later ages.  Routine screening at 53 recommended  Mammogram: No family history.  Scheduled for 09/15/2022 Cervical cancer screening: scheduled with Dr. Sabra Heck (GYN) Immunizations: tdap updated  today 09/09/2022, Influenza UTD 2023 (encouraged yearly) Infectious disease screening: HIV completed pregnancy, Hep C collected today DEXA: Routine screening - CBC with Differential/Platelet - Comprehensive metabolic panel - Hemoglobin A1c - Lipid panel - Hepatitis C Antibody Patient was encouraged to exercise greater than 150 minutes a week. Patient was encouraged to choose a diet filled with fresh fruits and vegetables, and lean meats. AVS provided to patient today for education/recommendation on gender specific health and safety maintenance.  Return in about 1 year (around 09/11/2023) for cpe (20 min).   Orders Placed This Encounter  Procedures   Tdap vaccine greater than or equal to 7yo IM   CBC with Differential/Platelet   Comprehensive metabolic panel   Hemoglobin A1c   Lipid panel   Hepatitis C Antibody   No orders of the defined types were placed in this encounter.  Referral Orders  No referral(s) requested today     Electronically signed by: Howard Pouch, [place]

## 2022-09-09 NOTE — Patient Instructions (Signed)
No follow-ups on file.        Great to see you today.  I have refilled the medication(s) we provide.   If labs were collected, we will inform you of lab results once received either by echart message or telephone call.   - echart message- for normal results that have been seen by the patient already.   - telephone call: abnormal results or if patient has not viewed results in their echart.  

## 2022-09-10 LAB — HEPATITIS C ANTIBODY: Hepatitis C Ab: NONREACTIVE

## 2022-09-13 ENCOUNTER — Ambulatory Visit: Payer: 59

## 2022-09-13 DIAGNOSIS — R0683 Snoring: Secondary | ICD-10-CM

## 2022-09-13 DIAGNOSIS — G4733 Obstructive sleep apnea (adult) (pediatric): Secondary | ICD-10-CM

## 2022-09-15 ENCOUNTER — Ambulatory Visit
Admission: RE | Admit: 2022-09-15 | Discharge: 2022-09-15 | Disposition: A | Discharge status: Home / Self Care | Payer: 59 | Source: Ambulatory Visit | Attending: Family Medicine | Admitting: Family Medicine

## 2022-09-15 DIAGNOSIS — Z1231 Encounter for screening mammogram for malignant neoplasm of breast: Secondary | ICD-10-CM

## 2022-09-17 ENCOUNTER — Other Ambulatory Visit: Payer: Self-pay | Admitting: Family Medicine

## 2022-09-17 ENCOUNTER — Encounter: Payer: Self-pay | Admitting: Family Medicine

## 2022-09-17 DIAGNOSIS — R928 Other abnormal and inconclusive findings on diagnostic imaging of breast: Secondary | ICD-10-CM | POA: Insufficient documentation

## 2022-09-22 ENCOUNTER — Telehealth: Payer: Self-pay | Admitting: Pulmonary Disease

## 2022-09-22 DIAGNOSIS — G4733 Obstructive sleep apnea (adult) (pediatric): Secondary | ICD-10-CM | POA: Diagnosis not present

## 2022-09-22 NOTE — Telephone Encounter (Signed)
Called and spoke with patient. Patient verbalized understanding. Order has been placed for cpap machine.   Noting further needed.

## 2022-09-22 NOTE — Telephone Encounter (Signed)
HST showed severe OSA with AHI 29/ hr with sustained and prolonged oxygen desaturation, lowest 45% Suggest autoCPAP  5-15 cm, mask of choice OV with me/APP in 6 wks after starting therapy

## 2022-09-29 ENCOUNTER — Ambulatory Visit
Admission: RE | Admit: 2022-09-29 | Discharge: 2022-09-29 | Disposition: A | Discharge status: Home / Self Care | Payer: 59 | Source: Ambulatory Visit | Attending: Family Medicine | Admitting: Family Medicine

## 2022-09-29 ENCOUNTER — Encounter: Payer: Self-pay | Admitting: Family Medicine

## 2022-09-29 ENCOUNTER — Ambulatory Visit: Payer: 59

## 2022-09-29 DIAGNOSIS — R928 Other abnormal and inconclusive findings on diagnostic imaging of breast: Secondary | ICD-10-CM

## 2022-09-30 ENCOUNTER — Ambulatory Visit (HOSPITAL_BASED_OUTPATIENT_CLINIC_OR_DEPARTMENT_OTHER): Payer: 59 | Admitting: Obstetrics & Gynecology

## 2022-09-30 ENCOUNTER — Encounter (HOSPITAL_BASED_OUTPATIENT_CLINIC_OR_DEPARTMENT_OTHER): Payer: Self-pay | Admitting: Obstetrics & Gynecology

## 2022-09-30 ENCOUNTER — Other Ambulatory Visit (HOSPITAL_COMMUNITY)
Admission: RE | Admit: 2022-09-30 | Discharge: 2022-09-30 | Disposition: A | Discharge status: Home / Self Care | Payer: 59 | Source: Ambulatory Visit | Attending: Obstetrics & Gynecology | Admitting: Obstetrics & Gynecology

## 2022-09-30 VITALS — BP 124/86 | HR 70 | Ht 64.0 in | Wt 283.4 lb

## 2022-09-30 DIAGNOSIS — Z124 Encounter for screening for malignant neoplasm of cervix: Secondary | ICD-10-CM | POA: Insufficient documentation

## 2022-09-30 DIAGNOSIS — Z6841 Body Mass Index (BMI) 40.0 and over, adult: Secondary | ICD-10-CM

## 2022-09-30 DIAGNOSIS — G4733 Obstructive sleep apnea (adult) (pediatric): Secondary | ICD-10-CM | POA: Diagnosis not present

## 2022-09-30 DIAGNOSIS — Z01419 Encounter for gynecological examination (general) (routine) without abnormal findings: Secondary | ICD-10-CM

## 2022-09-30 DIAGNOSIS — Z8049 Family history of malignant neoplasm of other genital organs: Secondary | ICD-10-CM

## 2022-09-30 NOTE — Progress Notes (Signed)
42 y.o. G1P1 Married White or Caucasian female here for annual exam/new patient exam.  She used to be seen at [facility] ob/gyn. Cycles are regular about every 29 days.  Flow typically lasts 6.  Two days are heavy and she does have clots.  She uses super plus tampons on these days.  Bleeding tapers off and she will having cramping for about 12 hours and then cycle is done.  She does take ibuprofen during this cramping.  She can sometime have longer period of time between cycle.  Has been up to 53 days between cycles but cycles are normal even when she skips a cycle.    Mother passed in her 42's and had endometrial cancer but passed due to Covid in 2021.    Patient's last menstrual period was 08/12/2022.          Sexually active: Yes.    The current method of family planning is vasectomy.    Smoker:  no  Health Maintenance: Pap:  obtained today History of abnormal Pap:  no MMG:  09/16/2021, follow up yesterday was normal Colonoscopy:  guidelines reviewed Screening Labs: 09/09/2022   reports that she has never smoked. She has never been exposed to tobacco smoke. She has never used smokeless tobacco. She reports current alcohol use. She reports that she does not use drugs.  Past Medical History:  Diagnosis Date   Allergy    Seasonal with pollen   Frequent UTI    Heartburn    Hypertension affecting pregnancy    OSA (obstructive sleep apnea) 09/07/2022   Seasonal allergies     Past Surgical History:  Procedure Laterality Date   CESAREAN SECTION  2009   CHOLECYSTECTOMY  2011    Current Outpatient Medications  Medication Sig Dispense Refill   Ascorbic Acid (VITAMIN C WITH ROSE HIPS) 500 MG tablet Take 500 mg by mouth daily.     cholecalciferol (VITAMIN D3) 25 MCG (1000 UNIT) tablet Take 1,000 Units by mouth daily.     cyanocobalamin (VITAMIN B12) 1000 MCG tablet Take 1,000 mcg by mouth 3 (three) times a week.     fluticasone (FLONASE) 50 MCG/ACT nasal spray Place into both nostrils daily.      Multiple Vitamins-Minerals (AIRBORNE GUMMIES PO) Take by mouth.     gabapentin (NEURONTIN) 300 MG capsule Take 1 capsule (300 mg total) by mouth 3 (three) times daily as needed. (Patient not taking: Reported on 09/30/2022) 90 capsule 3   No current facility-administered medications for this visit.    Family History  Problem Relation Age of Onset   Hypertension Mother    Diabetes Mother    Heart disease Mother    Miscarriages / Stillbirths Mother    Obesity Mother    Endometrial cancer Mother    Sleep apnea Mother    Sleep apnea Father    Hypertension Father    Heart attack Father    Diabetes Father    Hearing loss Father    Dementia Father    Heart disease Father    Obesity Father    Vision loss Father    Hypertension Brother    Alcohol abuse Brother    Heart disease Maternal Grandmother    Arthritis Maternal Grandmother    Heart attack Maternal Grandfather    Colon cancer Maternal Grandfather    Cancer Paternal Grandmother    Hearing loss Paternal Grandmother    Dementia Paternal Grandmother    Colon cancer Paternal Grandfather     ROS: Constitutional:  negative Genitourinary:negative  Exam:   BP 124/86 (BP Location: Right Arm, Patient Position: Sitting, Cuff Size: Large)   Pulse 70   Ht 5' 4"$  (1.626 m) Comment: Reported  Wt 283 lb 6.4 oz (128.5 kg)   LMP 08/12/2022   BMI 48.65 kg/m   Height: 5' 4"$  (162.6 cm) (Reported)  General appearance: alert, cooperative and appears stated age Head: Normocephalic, without obvious abnormality, atraumatic Neck: no adenopathy, supple, symmetrical, trachea midline and thyroid normal to inspection and palpation Lungs: clear to auscultation bilaterally Breasts: normal appearance, no masses or tenderness Heart: regular rate and rhythm Abdomen: soft, non-tender; bowel sounds normal; no masses,  no organomegaly Extremities: extremities normal, atraumatic, no cyanosis or edema Skin: Skin color, texture, turgor normal. No rashes  or lesions Lymph nodes: Cervical, supraclavicular, and axillary nodes normal. No abnormal inguinal nodes palpated Neurologic: Grossly normal   Pelvic: External genitalia:  33m smooth nontender nodule to right of labia major (area where pt has recent infection)              Urethra:  normal appearing urethra with no masses, tenderness or lesions              Bartholins and Skenes: normal                 Vagina: normal appearing vagina with normal color and no discharge, no lesions              Cervix: no lesions              Pap taken: Yes.   Bimanual Exam:  Uterus:  normal size, contour, position, consistency, mobility, non-tender              Adnexa: normal adnexa and no mass, fullness, tenderness               Rectovaginal: Confirms               Anus:  normal sphincter tone, no lesions  Chaperone, TOctaviano Batty CMA, was present for exam.  Assessment/Plan: 1. Well woman exam with routine gynecological exam - Pap smear with HR HPV - Mammogram completed follow up yesterday - Colonoscopy guidelines reviewed - lab work done with PCP, Dr. KRaoul Pitch- vaccines reviewed/updated  2. Cervical cancer screening - Cytology - PAP( [place])  3. OSA (obstructive sleep apnea) - starting CPAP  4. BMI 45.0-49.9, adult ([place]  5. Family history of uterine cancer - in her mother diagnosed in her 767's

## 2022-10-01 LAB — CYTOLOGY - PAP
Adequacy: ABSENT
Comment: NEGATIVE
Diagnosis: NEGATIVE
High risk HPV: NEGATIVE

## 2022-10-11 ENCOUNTER — Ambulatory Visit: Payer: Managed Care, Other (non HMO) | Admitting: Family Medicine

## 2022-10-26 ENCOUNTER — Ambulatory Visit: Payer: Managed Care, Other (non HMO) | Admitting: Family Medicine

## 2022-11-24 NOTE — Progress Notes (Unsigned)
   [name], PhD, LAT, ATC acting as a scribe for [name], MD.  Robin Hopkins is a 42 y.o. female who presents to [facility] today for exacerbation of her R leg pain. Pt was last seen by Dr. Denyse Amass on 08/03/22 and her right lateral thigh pain was thought to be meralgia paresthetica. Weight loss was emphasized, she was provided contact information to 2 weight loss clinics, and she was prescribed gabapentin.   Today, pt reports the numbness is not as severe. She started a new job at the end of Dec, and she is moving around more. She has not been taking the Gabapentin.    Pertinent review of systems: No fevers or chills  Relevant historical information: Sleep apnea and obesity   Exam:  BP 138/88   Pulse 68   Ht  (1.626 m)   Wt 290 lb (131.5 kg)   SpO2 98%   BMI 49.78 kg/m  General: Well Developed, well nourished, and in no acute distress.   MSK: Normal gait and hip motion      Assessment and Plan: 42 y.o. female with right leg meralgia paresthetica.  Slight improvement as her activity level has changed.  Weight loss will continue to help this symptom.  If needed we could proceed to trial of diagnostic and therapeutic injection.  Her symptoms are not bad enough at this time to do that.  We talked about weight loss.  BMI is almost 50.  She is done all sorts of lifestyle interventions like weight watchers or my fitness pal and had temporary but not long-lasting benefits.  Best option for her is probably bariatric surgery based on her BMI.  We talked about that a little bit.  We also talked about GLP-1's which unfortunately for obesity are quite expensive.  Happy to place a referral to bariatric surgery anytime.  I am sure PCP would as well.  Check back with me as needed   PDMP not reviewed this encounter. No orders of the defined types were placed in this encounter.  No orders of the defined types were placed in this  encounter.    Discussed warning signs or symptoms. Please see discharge instructions. Patient expresses understanding.   The above documentation has been reviewed and is accurate and complete [name], M.D.

## 2022-11-25 ENCOUNTER — Ambulatory Visit: Payer: 59 | Admitting: Family Medicine

## 2022-11-25 VITALS — BP 138/88 | HR 68 | Ht 64.0 in | Wt 290.0 lb

## 2022-11-25 DIAGNOSIS — G5711 Meralgia paresthetica, right lower limb: Secondary | ICD-10-CM

## 2022-11-25 NOTE — Patient Instructions (Addendum)
Thank you for coming in today.   I think you have meralgia paresthetica.   Continue to work with weight loss.   If you want a referral to bariatric surgery to discuss options please let me or Dr Claiborne Billings know.

## 2023-01-18 IMAGING — DX DG CHEST 2V
2 series · 2 of 2 positions shown · non-contrast
Comparison: 05/22/2010

CLINICAL DATA: Chest pain

EXAM:
CHEST - 2 VIEW

[chest pa]
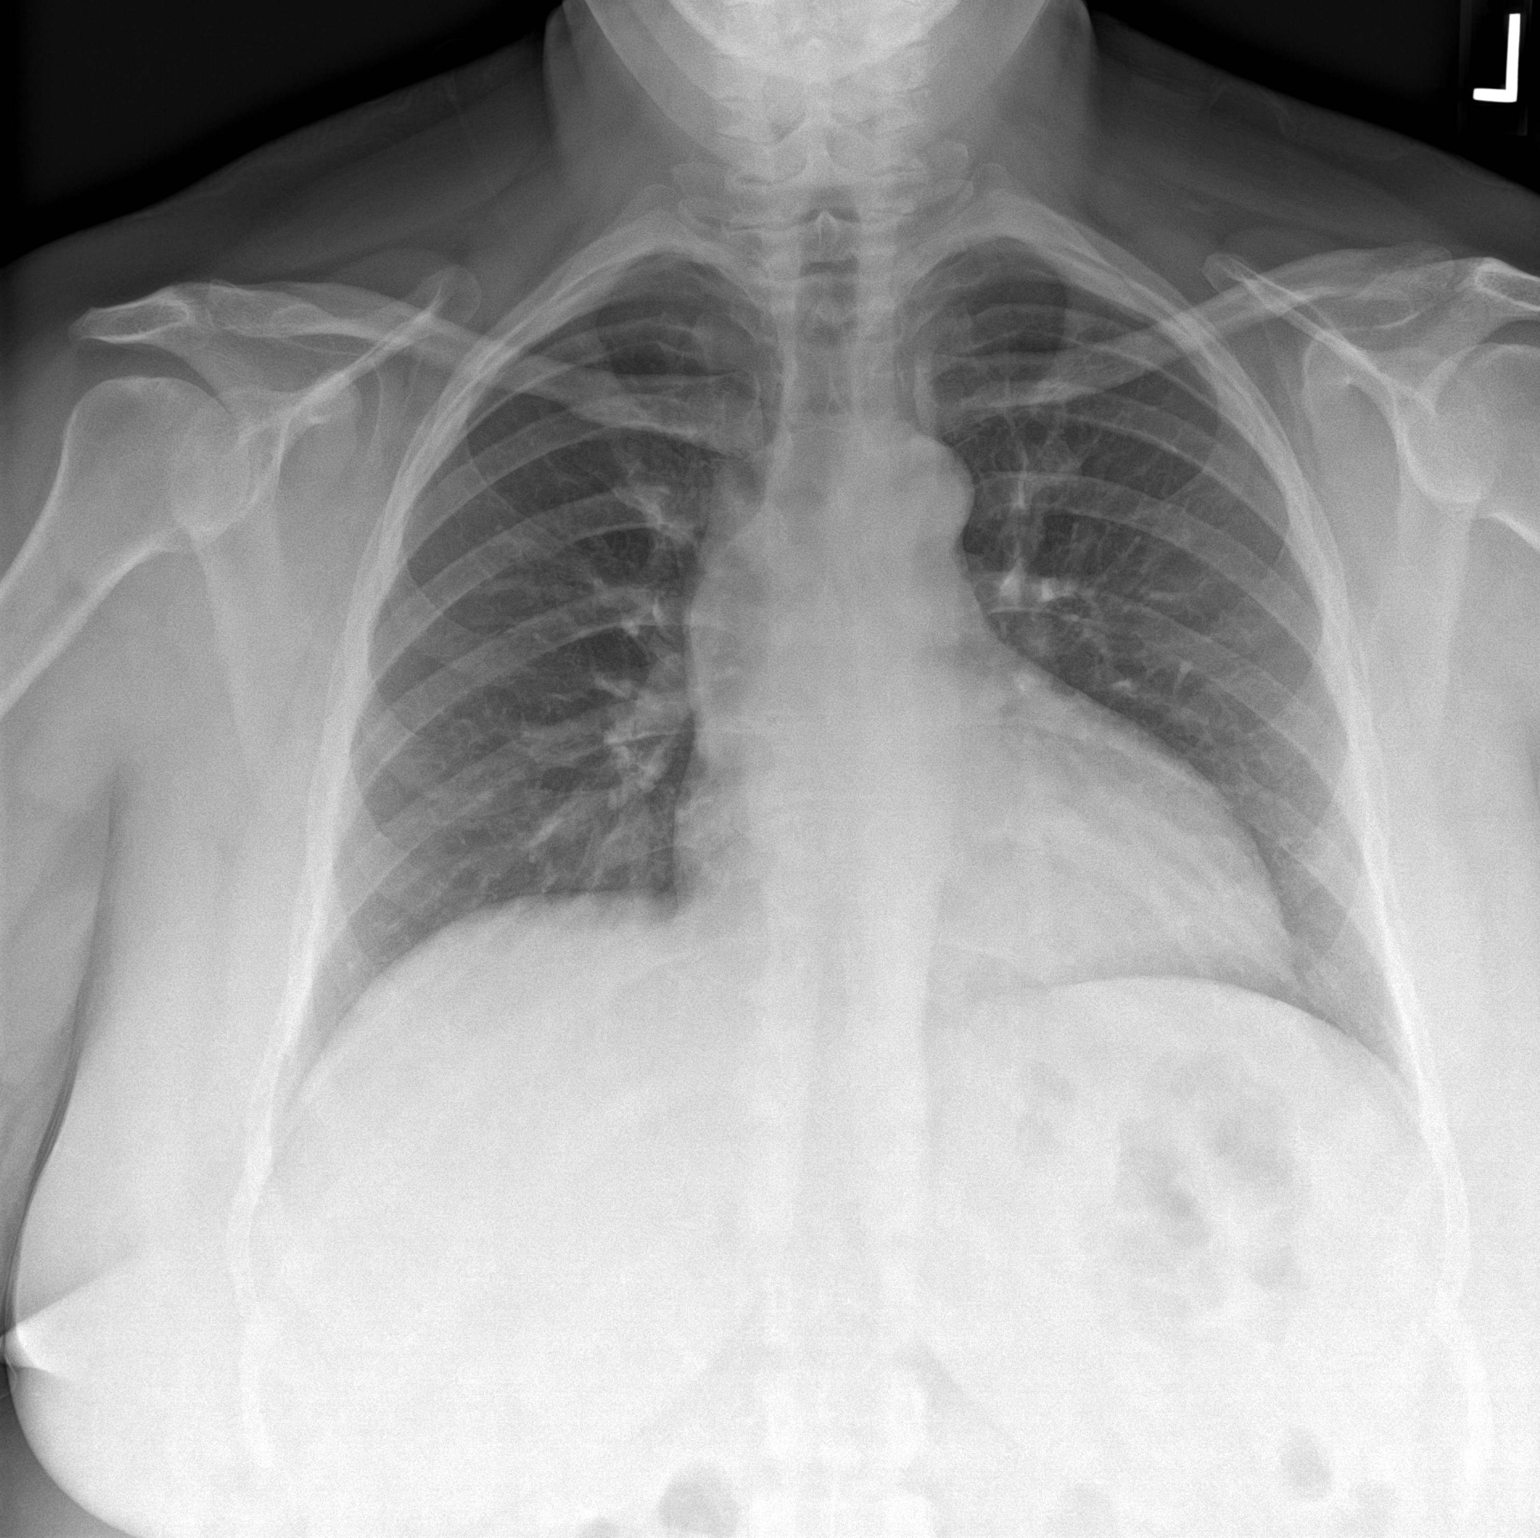

[chest lat]
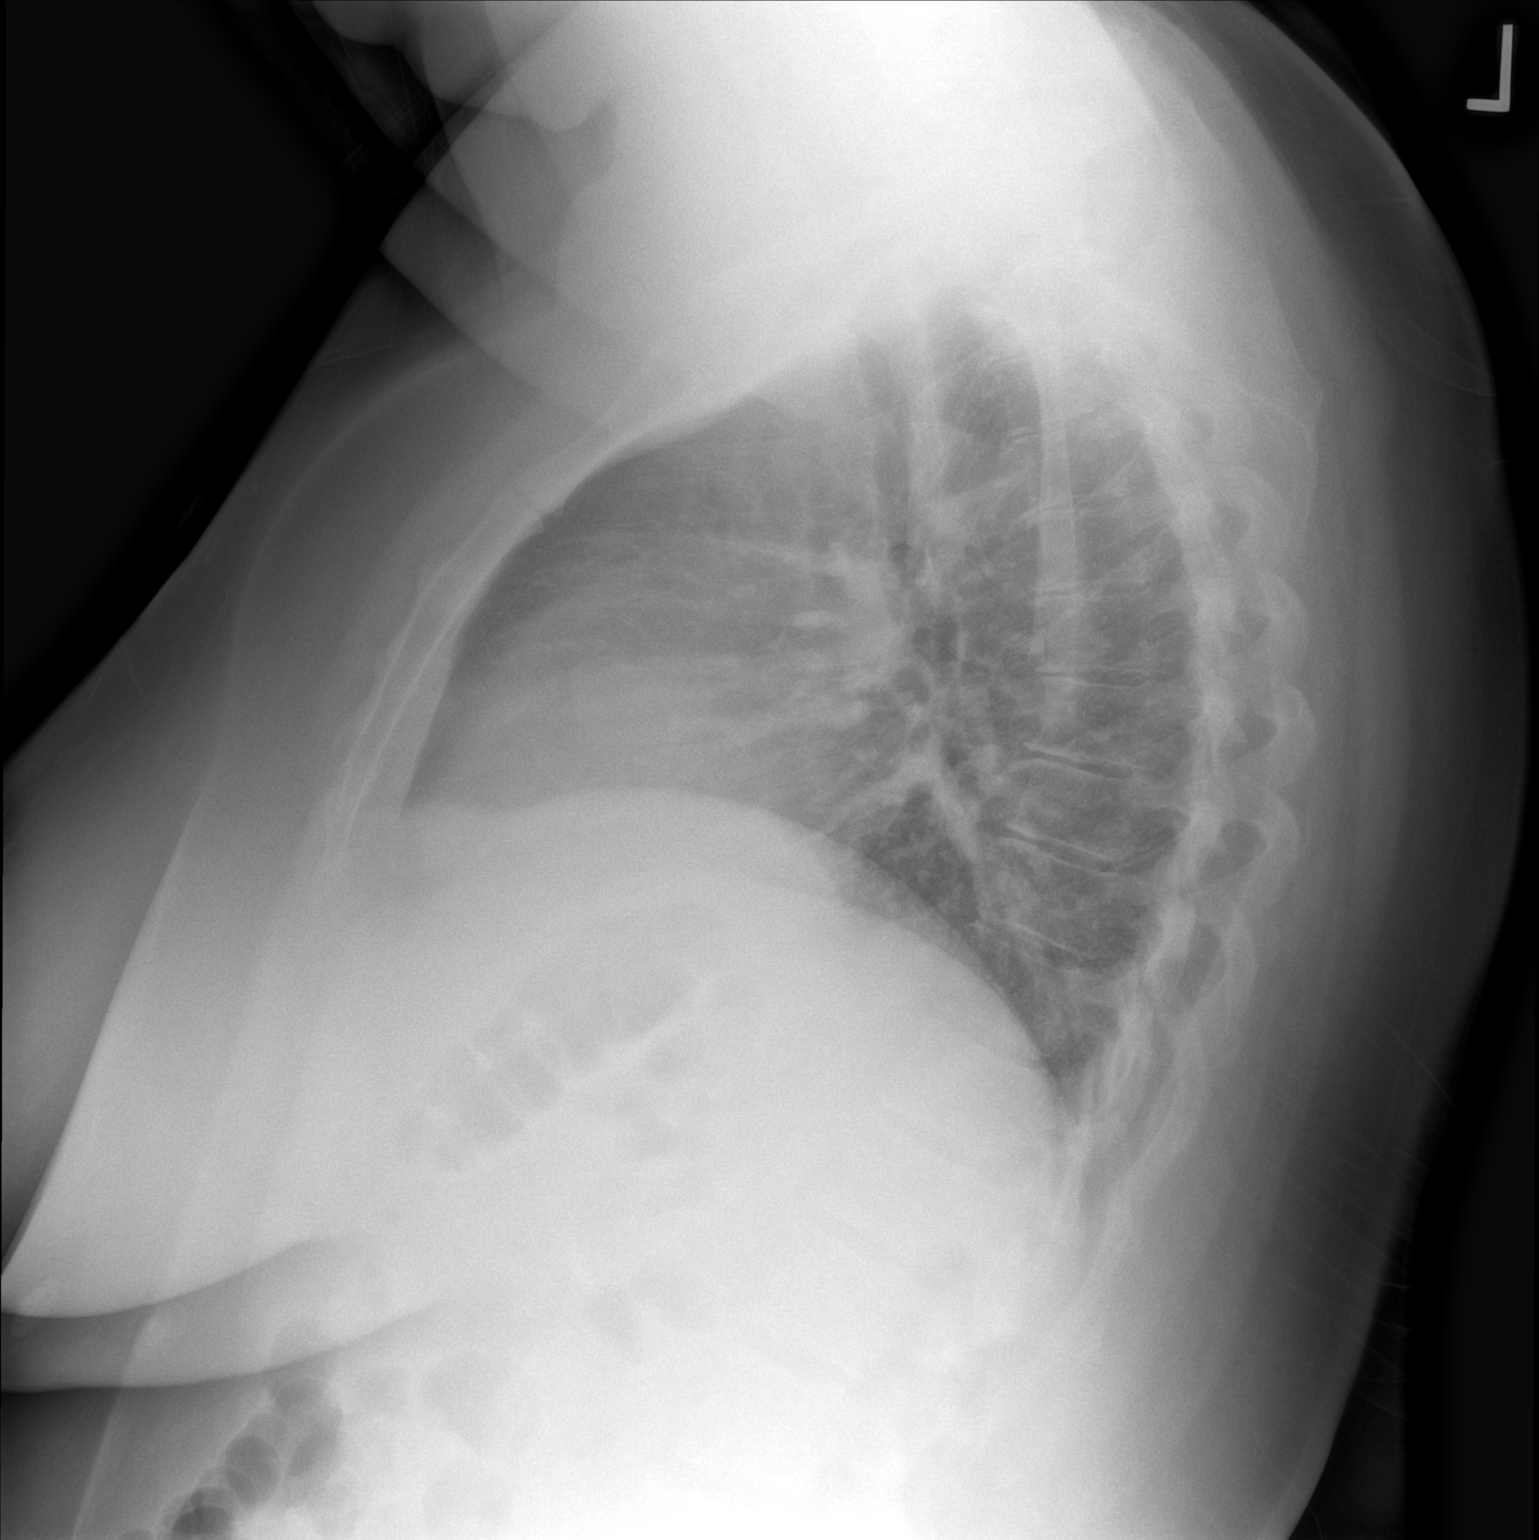

[2 of 2 positions shown; findings below may reference images not displayed]

FINDINGS: The heart size and mediastinal contours are within normal limits.
Both lungs are clear. The visualized skeletal structures are
unremarkable.
IMPRESSION: No active cardiopulmonary disease.

## 2023-09-20 ENCOUNTER — Encounter: Payer: 59 | Admitting: Family Medicine

## 2023-09-26 ENCOUNTER — Encounter: Payer: 59 | Admitting: Family Medicine

## 2023-10-05 ENCOUNTER — Encounter: Payer: Self-pay | Admitting: Family Medicine

## 2023-10-05 ENCOUNTER — Ambulatory Visit (INDEPENDENT_AMBULATORY_CARE_PROVIDER_SITE_OTHER): Payer: 59 | Admitting: Family Medicine

## 2023-10-05 VITALS — BP 130/80 | HR 90 | Temp 98.6°F | Ht 64.0 in | Wt 292.4 lb

## 2023-10-05 DIAGNOSIS — Z13220 Encounter for screening for lipoid disorders: Secondary | ICD-10-CM | POA: Diagnosis not present

## 2023-10-05 DIAGNOSIS — Z0000 Encounter for general adult medical examination without abnormal findings: Secondary | ICD-10-CM | POA: Diagnosis not present

## 2023-10-05 DIAGNOSIS — Z131 Encounter for screening for diabetes mellitus: Secondary | ICD-10-CM | POA: Diagnosis not present

## 2023-10-05 DIAGNOSIS — Z23 Encounter for immunization: Secondary | ICD-10-CM

## 2023-10-05 DIAGNOSIS — Z1231 Encounter for screening mammogram for malignant neoplasm of breast: Secondary | ICD-10-CM | POA: Diagnosis not present

## 2023-10-05 DIAGNOSIS — G4733 Obstructive sleep apnea (adult) (pediatric): Secondary | ICD-10-CM

## 2023-10-05 LAB — COMPREHENSIVE METABOLIC PANEL
ALT: 18 U/L (ref 0–35)
AST: 14 U/L (ref 0–37)
Albumin: 4.1 g/dL (ref 3.5–5.2)
Alkaline Phosphatase: 92 U/L (ref 39–117)
BUN: 12 mg/dL (ref 6–23)
CO2: 27 meq/L (ref 19–32)
Calcium: 8.8 mg/dL (ref 8.4–10.5)
Chloride: 104 meq/L (ref 96–112)
Creatinine, Ser: 0.68 mg/dL (ref 0.40–1.20)
GFR: 105.75 mL/min (ref >60.00)
Glucose, Bld: 101 mg/dL — ABNORMAL HIGH (ref 70–99)
Potassium: 4 meq/L (ref 3.5–5.1)
Sodium: 139 meq/L (ref 135–145)
Total Bilirubin: 0.6 mg/dL (ref 0.2–1.2)
Total Protein: 7.2 g/dL (ref 6.0–8.3)

## 2023-10-05 LAB — CBC
HCT: 39.8 % (ref 36.0–46.0)
Hemoglobin: 12.5 g/dL (ref 12.0–15.0)
MCHC: 31.5 g/dL (ref 30.0–36.0)
MCV: 81.1 fl (ref 78.0–100.0)
Platelets: 309 K/uL (ref 150.0–400.0)
RBC: 4.91 Mil/uL (ref 3.87–5.11)
RDW: 16.1 % — ABNORMAL HIGH (ref 11.5–15.5)
WBC: 8.1 K/uL (ref 4.0–10.5)

## 2023-10-05 LAB — HEMOGLOBIN A1C: Hgb A1c MFr Bld: 5.7 % (ref 4.6–6.5)

## 2023-10-05 LAB — LIPID PANEL
Cholesterol: 147 mg/dL (ref 0–200)
HDL: 40.6 mg/dL (ref >39.00)
LDL Cholesterol: 74 mg/dL (ref 0–99)
NonHDL: 106.44
Total CHOL/HDL Ratio: 4
Triglycerides: 163 mg/dL — ABNORMAL HIGH (ref 0.0–149.0)
VLDL: 32.6 mg/dL (ref 0.0–40.0)

## 2023-10-05 LAB — TSH: TSH: 0.83 u[IU]/mL (ref 0.35–5.50)

## 2023-10-05 NOTE — Progress Notes (Signed)
 Patient ID: Robin Hopkins, female  DOB: 07-09-1981, 43 y.o.   MRN: [id number] Patient Care Team    Relationship Specialty Notifications Start End  Robin Leatherwood, DO PCP - General Family Medicine  07/21/22   Robin Bears, MD Consulting Physician Obstetrics and Gynecology  09/09/22     Chief Complaint  Patient presents with   Annual Exam    Pt is fasting.     Subjective:  Robin Hopkins is a 43 y.o.  Female  present for CPE. All past medical history, surgical history, allergies, family history, immunizations, medications and social history were updated in the electronic medical record today. All recent labs, ED visits and hospitalizations within the last year were reviewed.  Health maintenance:  Colonoscopy: Colon cancer present in MGF and PGF at later ages.  Routine screening at 45 recommended  Mammogram: No family history.  Scheduled for 09/15/2022> [facility] Cervical cancer screening: 09/30/2022,  Dr. Hyacinth Meeker (GYN) Immunizations: tdap utd 09/09/2022, Influenza declined (encouraged yearly) Infectious disease screening: HIV completed pregnancy, Hep C completed DEXA: Routine screening Patient has a Dental home. Hospitalizations/ED visits: Reviewed     09/30/2022    1:44 PM 07/21/2022   10:31 AM  Depression screen PHQ 2/9  Decreased Interest 0 0  Down, Depressed, Hopeless 0 0  PHQ - 2 Score 0 0       No data to display          Immunization History  Administered Date(s) Administered   Hep A / Hep B 02/12/2014, 03/25/2014, 08/28/2014   IPV 02/12/2014   Influenza Split 07/24/2010   Influenza, Seasonal, Injecte, Preservative Fre 07/15/2015   Influenza-Unspecified 05/24/2022   PFIZER(Purple Top)SARS-COV-2 Vaccination 11/16/2019, 12/07/2019, 07/24/2020   Tdap 07/24/2010, 09/09/2022    Past Medical History:  Diagnosis Date   Allergy    Seasonal with pollen   Frequent UTI    Heartburn    Hypertension affecting pregnancy    OSA (obstructive sleep apnea) 09/07/2022    Seasonal allergies    No Known Allergies Past Surgical History:  Procedure Laterality Date   CESAREAN SECTION  2009   CHOLECYSTECTOMY  2011   Family History  Problem Relation Age of Onset   Hypertension Mother    Diabetes Mother    Heart disease Mother    Miscarriages / Stillbirths Mother    Obesity Mother    Endometrial cancer Mother 48 - 3       passed due to Covid   Sleep apnea Mother    Sleep apnea Father    Hypertension Father    Heart attack Father    Diabetes Father    Hearing loss Father    Dementia Father    Heart disease Father    Obesity Father    Vision loss Father    Hypertension Brother    Alcohol abuse Brother    Heart disease Maternal Grandmother    Arthritis Maternal Grandmother    Heart attack Maternal Grandfather    Colon cancer Maternal Grandfather    Cancer Paternal Grandmother    Hearing loss Paternal Grandmother    Dementia Paternal Grandmother    Colon cancer Paternal Grandfather    Social History   Social History Narrative   Marital status/children/pets: Married, 1 son   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 If you have any questions, ask your nurse or doctor.          STOP taking these medications    gabapentin 300 MG capsule Commonly known as: NEURONTIN Stopped by: Felix Pacini       TAKE these medications    AIRBORNE GUMMIES PO Take by mouth.   cholecalciferol 25 MCG (1000 UNIT) tablet Commonly known as: VITAMIN D3 Take 1,000 Units by mouth daily.   cyanocobalamin 1000 MCG tablet Commonly known as: VITAMIN B12 Take 1,000 mcg by mouth 3 (three) times a week.   fluticasone 50 MCG/ACT nasal spray Commonly known as: FLONASE Place into both nostrils  daily.   vitamin C with rose hips 500 MG tablet Take 500 mg by mouth daily.        All past medical history, surgical history, allergies, family history, immunizations andmedications were updated in the EMR today and reviewed under the history and medication portions of their EMR.     No results found for this or any previous visit (from the past 2160 hours).  No results found.   ROS 14 pt review of systems performed and negative (unless mentioned in an HPI)  Objective: BP 130/80   Pulse 90   Temp 98.6 F (37 C)   Ht 5\' 4"  (1.626 m)   Wt 292 lb 6.4 oz (132.6 kg)   LMP 08/25/2023   SpO2 99%   BMI 50.19 kg/m  Physical Exam Vitals and nursing note reviewed.  Constitutional:      General: She is not in acute distress.    Appearance: Normal appearance. She is not ill-appearing or toxic-appearing.  HENT:     Head: Normocephalic and atraumatic.     Right Ear: Tympanic membrane, ear canal and external ear normal. There is no impacted cerumen.     Left Ear: Tympanic membrane, ear canal and external ear normal. There is no impacted cerumen.     Nose: No congestion or rhinorrhea.     Mouth/Throat:     Mouth: Mucous membranes are moist.     Pharynx: Oropharynx is clear. No oropharyngeal exudate or posterior oropharyngeal erythema.  Eyes:     General: No scleral icterus.       Right eye: No discharge.        Left eye: No discharge.     Extraocular Movements: Extraocular movements intact.     Conjunctiva/sclera: Conjunctivae normal.     Pupils: Pupils are equal, round, and reactive to light.  Cardiovascular:     Rate and Rhythm: Normal rate and regular rhythm.     Pulses: Normal pulses.     Heart sounds: Normal heart sounds. No murmur heard.    No friction rub. No gallop.  Pulmonary:     Effort: Pulmonary effort is normal. No respiratory distress.     Breath sounds: Normal breath sounds. No stridor. No wheezing, rhonchi or rales.  Chest:     Chest wall: No tenderness.   Abdominal:     General: Abdomen is flat. Bowel sounds are normal. There is no distension.     Palpations: Abdomen is soft. There is no mass.     Tenderness: There is no abdominal tenderness. There is no right CVA tenderness, left CVA tenderness, guarding or rebound.     Hernia: No hernia is  present.  Musculoskeletal:        General: No swelling, tenderness or deformity. Normal range of motion.     Cervical back: Normal range of motion and neck supple. No rigidity or tenderness.     Right lower leg: No edema.     Left lower leg: No edema.  Lymphadenopathy:     Cervical: No cervical adenopathy.  Skin:    General: Skin is warm and dry.     Coloration: Skin is not jaundiced or pale.     Findings: No bruising, erythema, lesion or rash.  Neurological:     General: No focal deficit present.     Mental Status: She is alert and oriented to person, place, and time. Mental status is at baseline.     Cranial Nerves: No cranial nerve deficit.     Sensory: No sensory deficit.     Motor: No weakness.     Coordination: Coordination normal.     Gait: Gait normal.     Deep Tendon Reflexes: Reflexes normal.  Psychiatric:        Mood and Affect: Mood normal.        Behavior: Behavior normal.        Thought Content: Thought content normal.        Judgment: Judgment normal.      No results found.  Assessment/plan: Semone Orlov is a 43 y.o. female present for CPE  Routine general medical examination at a health care facility Patient was encouraged to exercise greater than 150 minutes a week. Patient was encouraged to choose a diet filled with fresh fruits and vegetables, and lean meats. AVS provided to patient today for education/recommendation on gender specific health and safety maintenance. Colonoscopy: Colon cancer present in MGF and PGF at later ages.  Routine screening at 43 recommended  Mammogram: No family history.  Scheduled for 09/15/2022> [facility] Cervical cancer screening: 09/30/2022,   Dr. Hyacinth Meeker (GYN) Immunizations: tdap utd 09/09/2022, Influenza declined (encouraged yearly) Infectious disease screening: HIV completed pregnancy, Hep C completed DEXA: Routine screening -Starting VIRTA program through work for weight loss  Return in about 1 year (around 10/05/2024) for cpe (20 min).   Orders Placed This Encounter  Procedures   MM 3D SCREENING MAMMOGRAM BILATERAL BREAST   CBC   Comprehensive metabolic panel   Hemoglobin A1c   Lipid panel   TSH   No orders of the defined types were placed in this encounter.  Referral Orders  No referral(s) requested today     Electronically signed by: Felix Pacini, DO [facility]

## 2023-10-05 NOTE — Patient Instructions (Addendum)
 Return in about 1 year (around 10/05/2024) for cpe (20 min).        Great to see you today.  I have refilled the medication(s) we provide.   If labs were collected or images ordered, we will inform you of  results once we have received them and reviewed. We will contact you either by echart message, or telephone call.  Please give ample time to the testing facility, and our office to run,  receive and review results. Please do not call inquiring of results, even if you can see them in your chart. We will contact you as soon as we are able. If it has been over 1 week since the test was completed, and you have not yet heard from [place], then please call us.    - echart message- for normal results that have been seen by the patient already.   - telephone call: abnormal results or if patient has not viewed results in their echart.  If a referral to a specialist was entered for you, please call us in 2 weeks if you have not heard from the specialist office to schedule.

## 2023-10-06 ENCOUNTER — Encounter: Payer: Self-pay | Admitting: Family Medicine

## 2023-10-13 ENCOUNTER — Ambulatory Visit
Admission: RE | Admit: 2023-10-13 | Discharge: 2023-10-13 | Disposition: A | Discharge status: Home / Self Care | Payer: 59 | Source: Ambulatory Visit | Attending: Family Medicine | Admitting: Family Medicine

## 2023-10-13 DIAGNOSIS — Z1231 Encounter for screening mammogram for malignant neoplasm of breast: Secondary | ICD-10-CM

## 2023-10-18 ENCOUNTER — Encounter: Payer: Self-pay | Admitting: Family Medicine

## 2024-05-05 ENCOUNTER — Other Ambulatory Visit: Payer: Self-pay

## 2024-05-05 ENCOUNTER — Other Ambulatory Visit (HOSPITAL_BASED_OUTPATIENT_CLINIC_OR_DEPARTMENT_OTHER): Payer: Self-pay

## 2024-05-05 ENCOUNTER — Emergency Department (HOSPITAL_BASED_OUTPATIENT_CLINIC_OR_DEPARTMENT_OTHER)
Admission: EM | Admit: 2024-05-05 | Discharge: 2024-05-05 | Disposition: A | Discharge status: Home / Self Care | Attending: Emergency Medicine | Admitting: Emergency Medicine

## 2024-05-05 ENCOUNTER — Emergency Department (HOSPITAL_BASED_OUTPATIENT_CLINIC_OR_DEPARTMENT_OTHER)

## 2024-05-05 DIAGNOSIS — R109 Unspecified abdominal pain: Secondary | ICD-10-CM | POA: Diagnosis present

## 2024-05-05 DIAGNOSIS — N201 Calculus of ureter: Secondary | ICD-10-CM | POA: Diagnosis not present

## 2024-05-05 DIAGNOSIS — N200 Calculus of kidney: Secondary | ICD-10-CM

## 2024-05-05 LAB — CBC WITH DIFFERENTIAL/PLATELET
Abs Immature Granulocytes: 0.03 K/uL (ref 0.00–0.07)
Basophils Absolute: 0.1 K/uL (ref 0.0–0.1)
Basophils Relative: 1 %
Eosinophils Absolute: 0 K/uL (ref 0.0–0.5)
Eosinophils Relative: 0 %
HCT: 38.3 % (ref 36.0–46.0)
Hemoglobin: 11.8 g/dL — ABNORMAL LOW (ref 12.0–15.0)
Immature Granulocytes: 0 %
Lymphocytes Relative: 11 %
Lymphs Abs: 1.2 K/uL (ref 0.7–4.0)
MCH: 25.2 pg — ABNORMAL LOW (ref 26.0–34.0)
MCHC: 30.8 g/dL (ref 30.0–36.0)
MCV: 81.7 fL (ref 80.0–100.0)
Monocytes Absolute: 0.6 K/uL (ref 0.1–1.0)
Monocytes Relative: 6 %
Neutro Abs: 9 K/uL — ABNORMAL HIGH (ref 1.7–7.7)
Neutrophils Relative %: 82 %
Platelets: 294 K/uL (ref 150–400)
RBC: 4.69 MIL/uL (ref 3.87–5.11)
RDW: 15.8 % — ABNORMAL HIGH (ref 11.5–15.5)
WBC: 11 K/uL — ABNORMAL HIGH (ref 4.0–10.5)
nRBC: 0 % (ref 0.0–0.2)

## 2024-05-05 LAB — BASIC METABOLIC PANEL WITH GFR
Anion gap: 16 — ABNORMAL HIGH (ref 5–15)
BUN: 13 mg/dL (ref 6–20)
CO2: 23 mmol/L (ref 22–32)
Calcium: 9.3 mg/dL (ref 8.9–10.3)
Chloride: 102 mmol/L (ref 98–111)
Creatinine, Ser: 1.03 mg/dL — ABNORMAL HIGH (ref 0.44–1.00)
GFR, Estimated: >60 mL/min (ref >60)
Glucose, Bld: 144 mg/dL — ABNORMAL HIGH (ref 70–99)
Potassium: 3.7 mmol/L (ref 3.5–5.1)
Sodium: 140 mmol/L (ref 135–145)

## 2024-05-05 LAB — HCG, SERUM, QUALITATIVE: Preg, Serum: NEGATIVE

## 2024-05-05 MED ORDER — KETOROLAC TROMETHAMINE 30 MG/ML IJ SOLN
30.0000 mg | Freq: Once | INTRAMUSCULAR | Status: AC
  Administered 2024-05-05: 30 mg via INTRAVENOUS
  Filled 2024-05-05: qty 1

## 2024-05-05 MED ORDER — OXYCODONE-ACETAMINOPHEN 5-325 MG PO TABS: 1.0000 | ORAL_TABLET | Freq: Four times a day (QID) | ORAL | 0 refills | Status: DC | PRN

## 2024-05-05 MED ORDER — ONDANSETRON 8 MG PO TBDP
8.0000 mg | ORAL_TABLET | ORAL | 0 refills | Status: AC | PRN
  Filled 2024-05-05: qty 10, 2d supply, fill #0

## 2024-05-05 MED ORDER — MORPHINE SULFATE (PF) 4 MG/ML IV SOLN
4.0000 mg | Freq: Once | INTRAVENOUS | Status: AC
  Administered 2024-05-05: 4 mg via INTRAVENOUS
  Filled 2024-05-05: qty 1

## 2024-05-05 MED ORDER — ONDANSETRON HCL 4 MG/2ML IJ SOLN
4.0000 mg | Freq: Once | INTRAMUSCULAR | Status: AC
  Administered 2024-05-05: 4 mg via INTRAVENOUS
  Filled 2024-05-05: qty 2

## 2024-05-05 MED ORDER — OXYCODONE-ACETAMINOPHEN 5-325 MG PO TABS
1.0000 | ORAL_TABLET | Freq: Four times a day (QID) | ORAL | 0 refills | Status: AC | PRN
  Filled 2024-05-05: qty 20, 3d supply, fill #0

## 2024-05-05 NOTE — ED Triage Notes (Signed)
 Tuesday pt began having mild left flank pain that resolved. This morning at 0100 left flank pain came back and has gotten worsse. Endorses NV and chills. Denies urinary symptoms. No back injury

## 2024-05-05 NOTE — Discharge Instructions (Signed)
 Begin taking Percocet as prescribed as needed for pain.  Follow-up with urology if symptoms are not improving in the next few days.  The contact information for [facility] has been provided in this discharge summary for you to call and make these arrangements.  Return to the ER in the meantime if symptoms significantly worsen or change.

## 2024-05-05 NOTE — ED Provider Notes (Signed)
 [place] EMERGENCY DEPARTMENT AT [facility] Provider Note   CSN: [id number] Arrival date & time: 05/05/24  [id number]     Patient presents with: Flank Pain (left)   Robin Hopkins is a 43 y.o. female.   Patient is a 43 year old female presenting with left flank pain.  Symptoms began 2 days ago, then resolved.  Pain returned this evening and was much more severe.  She describes severe pain to the left flank radiating into her left groin.  This is associated with nausea and vomiting.  She denies any bowel or urinary complaints.  No fevers or chills.  No history of kidney stones.       Prior to Admission medications   Medication Sig Start Date End Date Taking? Authorizing Provider  Ascorbic Acid (VITAMIN C WITH ROSE HIPS) 500 MG tablet Take 500 mg by mouth daily.    [provider]  cholecalciferol (VITAMIN D3) 25 MCG (1000 UNIT) tablet Take 1,000 Units by mouth daily.    [provider]  cyanocobalamin (VITAMIN B12) 1000 MCG tablet Take 1,000 mcg by mouth 3 (three) times a week.    [provider]  fluticasone (FLONASE) 50 MCG/ACT nasal spray Place into both nostrils daily.    [provider]  Multiple Vitamins-Minerals (AIRBORNE GUMMIES PO) Take by mouth.    [provider]    Allergies: Patient has no known allergies.    Review of Systems  All other systems reviewed and are negative.   Updated Vital Signs BP (!) 159/113   Pulse 84   Temp 98.9 F (37.2 C)   Resp 20   Ht 5' 3 (1.6 m)   Wt 131.5 kg   SpO2 100%   BMI 51.37 kg/m   Physical Exam Vitals and nursing note reviewed.  Constitutional:      General: She is not in acute distress.    Appearance: She is well-developed. She is not diaphoretic.  HENT:     Head: Normocephalic and atraumatic.  Cardiovascular:     Rate and Rhythm: Normal rate and regular rhythm.     Heart sounds: No murmur heard.    No friction rub. No gallop.  Pulmonary:     Effort: Pulmonary  effort is normal. No respiratory distress.     Breath sounds: Normal breath sounds. No wheezing.  Abdominal:     General: Bowel sounds are normal. There is no distension.     Palpations: Abdomen is soft.     Tenderness: There is no abdominal tenderness. There is left CVA tenderness. There is no right CVA tenderness, guarding or rebound.  Musculoskeletal:        General: Normal range of motion.     Cervical back: Normal range of motion and neck supple.  Skin:    General: Skin is warm and dry.  Neurological:     General: No focal deficit present.     Mental Status: She is alert and oriented to person, place, and time.     (all labs ordered are listed, but only abnormal results are displayed) Labs Reviewed - No data to display  EKG: None  Radiology: No results found.   Procedures   Medications Ordered in the ED  morphine (PF) 4 MG/ML injection 4 mg (has no administration in time range)  ketorolac  (TORADOL ) 30 MG/ML injection 30 mg (has no administration in time range)  ondansetron (ZOFRAN) injection 4 mg (has no administration in time range)  Medical Decision Making Amount and/or Complexity of Data Reviewed Labs: ordered. Radiology: ordered.  Risk Prescription drug management.   Patient is a 43 year old female presenting with left flank pain.  Patient arrives with stable vital signs and is afebrile.  Physical examination reveals left-sided CVA tenderness, but benign abdomen.  Laboratory studies obtained including CBC and basic metabolic panel, both of which are unremarkable.  CT with renal protocol shows a 3 to 4 mm stone in the left distal ureter with obstruction.  Patient has been given morphine and Toradol  for pain along with Zofran for nausea and is feeling markedly improved.  She will be discharged with pain medication and as needed follow-up with urology.     Final diagnoses:  None    ED Discharge Orders     None           Geroldine Berg, MD 05/05/24 [phone]

## 2024-10-04 ENCOUNTER — Encounter: Payer: 59 | Admitting: Family Medicine
